# Patient Record
Sex: Female | Born: 1980 | Hispanic: No | Marital: Single | Smoking: Never smoker
Health system: Southern US, Community
[De-identification: ages and names within clinical notes are randomized; demographics above are authoritative.]

## PROBLEM LIST (undated history)

## (undated) ENCOUNTER — Inpatient Hospital Stay (HOSPITAL_COMMUNITY): Payer: Self-pay

## (undated) DIAGNOSIS — F329 Major depressive disorder, single episode, unspecified: Secondary | ICD-10-CM

## (undated) DIAGNOSIS — N809 Endometriosis, unspecified: Secondary | ICD-10-CM

## (undated) DIAGNOSIS — F909 Attention-deficit hyperactivity disorder, unspecified type: Secondary | ICD-10-CM

## (undated) DIAGNOSIS — F419 Anxiety disorder, unspecified: Secondary | ICD-10-CM

## (undated) DIAGNOSIS — R51 Headache: Secondary | ICD-10-CM

## (undated) DIAGNOSIS — E039 Hypothyroidism, unspecified: Secondary | ICD-10-CM

## (undated) DIAGNOSIS — F32A Depression, unspecified: Secondary | ICD-10-CM

## (undated) DIAGNOSIS — R519 Headache, unspecified: Secondary | ICD-10-CM

## (undated) DIAGNOSIS — G40209 Localization-related (focal) (partial) symptomatic epilepsy and epileptic syndromes with complex partial seizures, not intractable, without status epilepticus: Secondary | ICD-10-CM

## (undated) HISTORY — PX: OTHER SURGICAL HISTORY: SHX169

## (undated) HISTORY — PX: TONSILLECTOMY: SUR1361

## (undated) HISTORY — DX: Headache, unspecified: R51.9

## (undated) HISTORY — PX: ADENOIDECTOMY: SHX5191

## (undated) HISTORY — DX: Depression, unspecified: F32.A

## (undated) HISTORY — DX: Anxiety disorder, unspecified: F41.9

## (undated) HISTORY — DX: Major depressive disorder, single episode, unspecified: F32.9

## (undated) HISTORY — PX: SEPTOPLASTY: SUR1290

## (undated) HISTORY — DX: Headache: R51

---
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
## 2002-07-29 ENCOUNTER — Encounter: Admission: RE | Admit: 2002-07-29 | Discharge: 2002-07-29 | Payer: Self-pay | Admitting: Sports Medicine

## 2002-08-04 ENCOUNTER — Encounter: Admission: RE | Admit: 2002-08-04 | Discharge: 2002-08-04 | Payer: Self-pay | Admitting: Sports Medicine

## 2002-08-11 ENCOUNTER — Encounter: Admission: RE | Admit: 2002-08-11 | Discharge: 2002-08-11 | Payer: Self-pay | Admitting: Sports Medicine

## 2002-08-18 ENCOUNTER — Ambulatory Visit (HOSPITAL_COMMUNITY): Admission: RE | Admit: 2002-08-18 | Discharge: 2002-08-18 | Payer: Self-pay | Admitting: Sports Medicine

## 2002-10-05 ENCOUNTER — Encounter: Admission: RE | Admit: 2002-10-05 | Discharge: 2002-10-05 | Payer: Self-pay | Admitting: Family Medicine

## 2002-10-07 ENCOUNTER — Encounter: Admission: RE | Admit: 2002-10-07 | Discharge: 2002-10-07 | Payer: Self-pay | Admitting: Sports Medicine

## 2002-11-04 ENCOUNTER — Emergency Department (HOSPITAL_COMMUNITY): Admission: EM | Admit: 2002-11-04 | Discharge: 2002-11-04 | Payer: Self-pay | Admitting: Emergency Medicine

## 2002-11-12 ENCOUNTER — Encounter: Admission: RE | Admit: 2002-11-12 | Discharge: 2002-11-12 | Payer: Self-pay | Admitting: Sports Medicine

## 2002-11-13 ENCOUNTER — Ambulatory Visit (HOSPITAL_COMMUNITY): Admission: RE | Admit: 2002-11-13 | Discharge: 2002-11-13 | Payer: Self-pay | Admitting: Sports Medicine

## 2002-12-01 ENCOUNTER — Encounter: Admission: RE | Admit: 2002-12-01 | Discharge: 2002-12-01 | Payer: Self-pay | Admitting: Sports Medicine

## 2002-12-31 ENCOUNTER — Encounter: Admission: RE | Admit: 2002-12-31 | Discharge: 2002-12-31 | Payer: Self-pay

## 2003-01-13 ENCOUNTER — Encounter: Admission: RE | Admit: 2003-01-13 | Discharge: 2003-01-13 | Payer: Self-pay | Admitting: Family Medicine

## 2003-10-04 ENCOUNTER — Encounter: Admission: RE | Admit: 2003-10-04 | Discharge: 2003-10-04 | Payer: Self-pay | Admitting: Family Medicine

## 2003-11-19 ENCOUNTER — Encounter (INDEPENDENT_AMBULATORY_CARE_PROVIDER_SITE_OTHER): Payer: Self-pay | Admitting: *Deleted

## 2003-11-19 LAB — CONVERTED CEMR LAB

## 2003-11-29 ENCOUNTER — Emergency Department (HOSPITAL_COMMUNITY): Admission: EM | Admit: 2003-11-29 | Discharge: 2003-11-29 | Payer: Self-pay | Admitting: *Deleted

## 2003-12-01 ENCOUNTER — Encounter: Admission: RE | Admit: 2003-12-01 | Discharge: 2003-12-01 | Payer: Self-pay | Admitting: Family Medicine

## 2003-12-31 ENCOUNTER — Encounter: Admission: RE | Admit: 2003-12-31 | Discharge: 2003-12-31 | Payer: Self-pay | Admitting: Family Medicine

## 2004-01-03 ENCOUNTER — Encounter: Admission: RE | Admit: 2004-01-03 | Discharge: 2004-01-03 | Payer: Self-pay | Admitting: Sports Medicine

## 2004-01-04 ENCOUNTER — Encounter: Admission: RE | Admit: 2004-01-04 | Discharge: 2004-01-04 | Payer: Self-pay | Admitting: Family Medicine

## 2004-03-15 ENCOUNTER — Ambulatory Visit: Payer: Self-pay | Admitting: Sports Medicine

## 2004-05-25 ENCOUNTER — Emergency Department (HOSPITAL_COMMUNITY): Admission: EM | Admit: 2004-05-25 | Discharge: 2004-05-25 | Payer: Self-pay | Admitting: Family Medicine

## 2004-08-25 ENCOUNTER — Ambulatory Visit: Payer: Self-pay | Admitting: Sports Medicine

## 2004-09-08 ENCOUNTER — Ambulatory Visit: Payer: Self-pay | Admitting: Family Medicine

## 2004-09-13 ENCOUNTER — Ambulatory Visit: Payer: Self-pay | Admitting: Family Medicine

## 2005-11-10 ENCOUNTER — Emergency Department (HOSPITAL_COMMUNITY): Admission: EM | Admit: 2005-11-10 | Discharge: 2005-11-11 | Payer: Self-pay | Admitting: Emergency Medicine

## 2005-11-12 ENCOUNTER — Ambulatory Visit: Payer: Self-pay | Admitting: Sports Medicine

## 2006-07-18 DIAGNOSIS — E049 Nontoxic goiter, unspecified: Secondary | ICD-10-CM | POA: Insufficient documentation

## 2006-07-19 ENCOUNTER — Encounter (INDEPENDENT_AMBULATORY_CARE_PROVIDER_SITE_OTHER): Payer: Self-pay | Admitting: *Deleted

## 2007-07-16 ENCOUNTER — Encounter: Payer: Self-pay | Admitting: Family Medicine

## 2007-07-16 ENCOUNTER — Encounter: Payer: Self-pay | Admitting: *Deleted

## 2007-07-16 ENCOUNTER — Ambulatory Visit: Payer: Self-pay | Admitting: Family Medicine

## 2007-07-16 DIAGNOSIS — N949 Unspecified condition associated with female genital organs and menstrual cycle: Secondary | ICD-10-CM | POA: Insufficient documentation

## 2007-07-16 DIAGNOSIS — R87610 Atypical squamous cells of undetermined significance on cytologic smear of cervix (ASC-US): Secondary | ICD-10-CM | POA: Insufficient documentation

## 2007-07-16 LAB — CONVERTED CEMR LAB

## 2007-07-18 ENCOUNTER — Encounter: Admission: RE | Admit: 2007-07-18 | Discharge: 2007-07-18 | Payer: Self-pay | Admitting: Family Medicine

## 2007-07-22 ENCOUNTER — Encounter: Payer: Self-pay | Admitting: Family Medicine

## 2007-07-22 ENCOUNTER — Telehealth: Payer: Self-pay | Admitting: *Deleted

## 2008-08-14 IMAGING — US US TRANSVAGINAL NON-OB
1 series · 14 of 25 positions shown · non-contrast
Comparison: none

CLINICAL DATA: Pelvic pain.
 TRANSABDOMINAL AND TRANSVAGINAL PELVIC ULTRASOUND:
TECHNIQUE: Both transabdominal and transvaginal ultrasound examinations of the pelvis were performed, including evaluation of the uterus, ovaries, adnexal regions, and pelvic cul-de-sac.
 Uterus measures 9.5 cm sagittally with a depth of 5.5 cm and width of 6.3 cm.  Nabothian cysts are noted.  Endometrium is normal measuring 4.5 mm in thickness.   The right ovary is larger than the left containing a cyst with thin septations measuring 5.0 x 3.8 x 4.8 cm.  Only a trace amount of fluid is noted.

[Series 1: us transvaginal non-ob · 0.26mm/px · 14 of 63 slices shown]
[im 1/63]
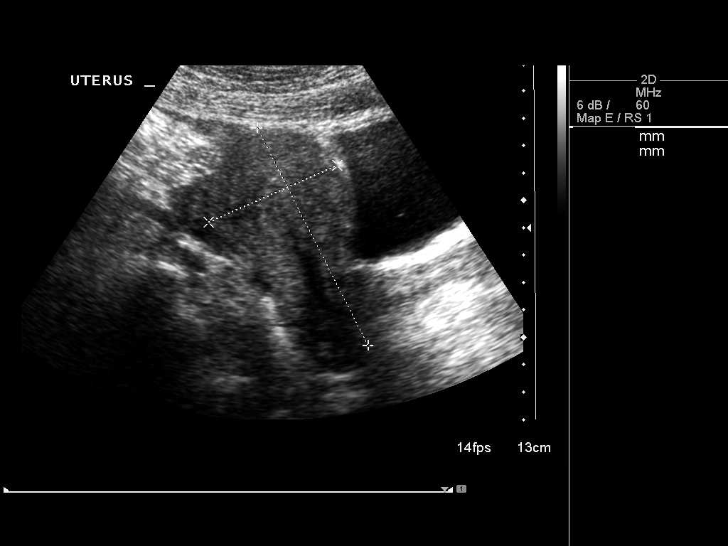
[im 6/63]
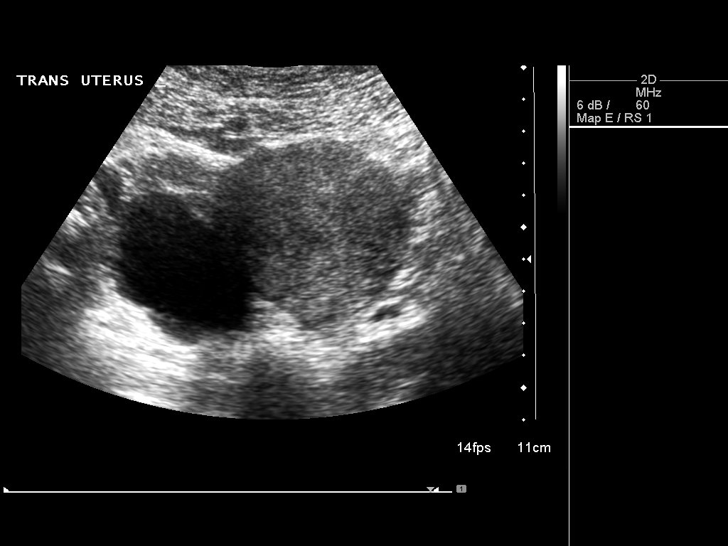
[im 11/63]
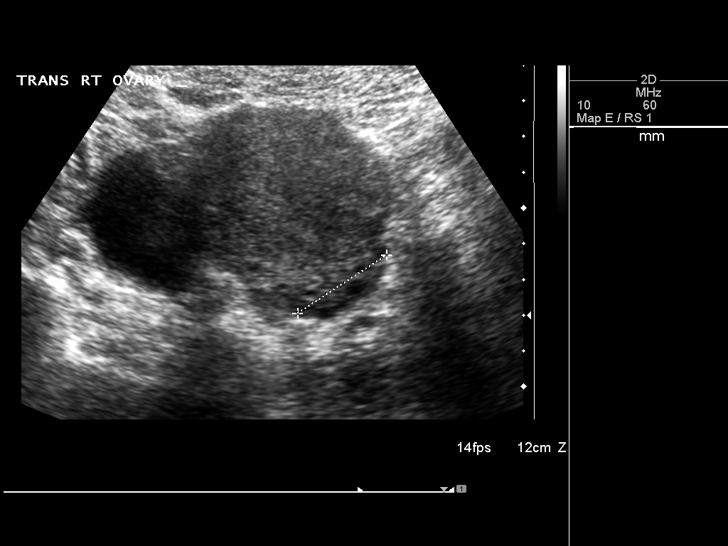
[im 16/63]
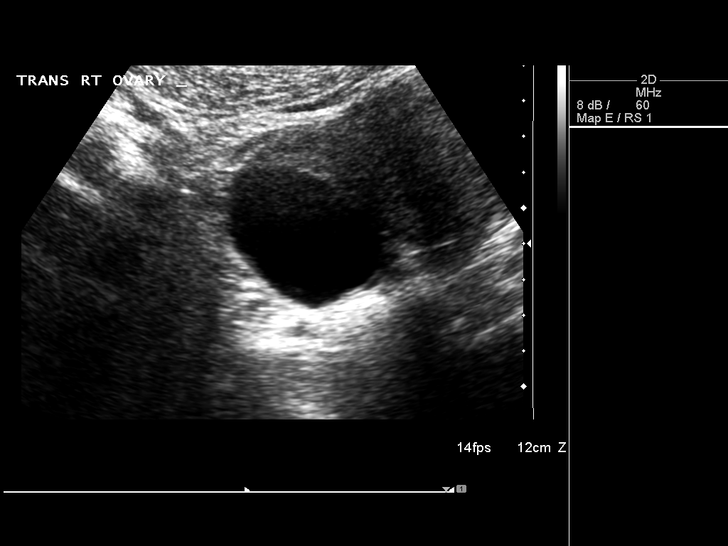
[im 21/63]
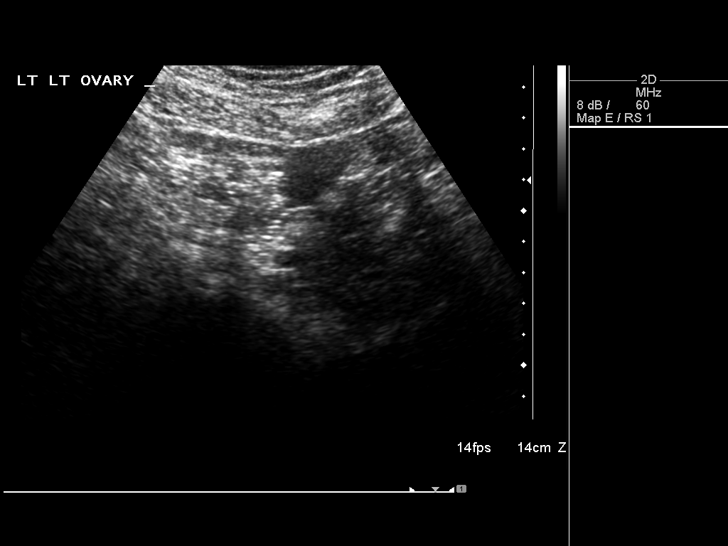
[im 24/63]
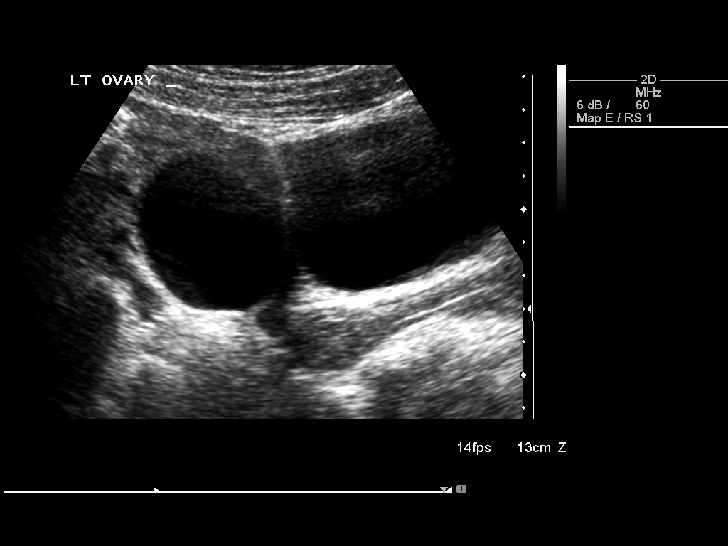
[im 29/63]
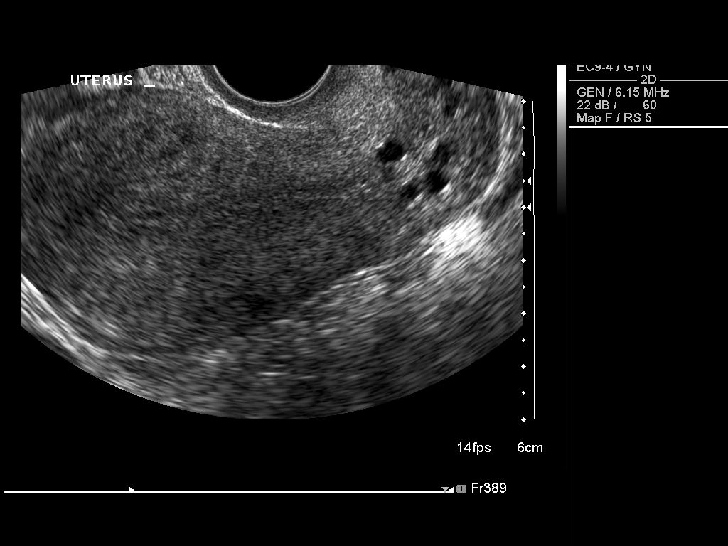
[im 34/63]
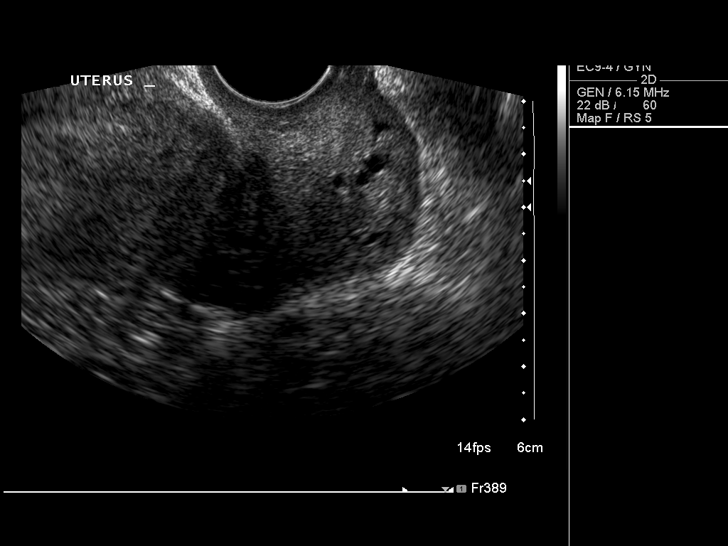
[im 39/63]
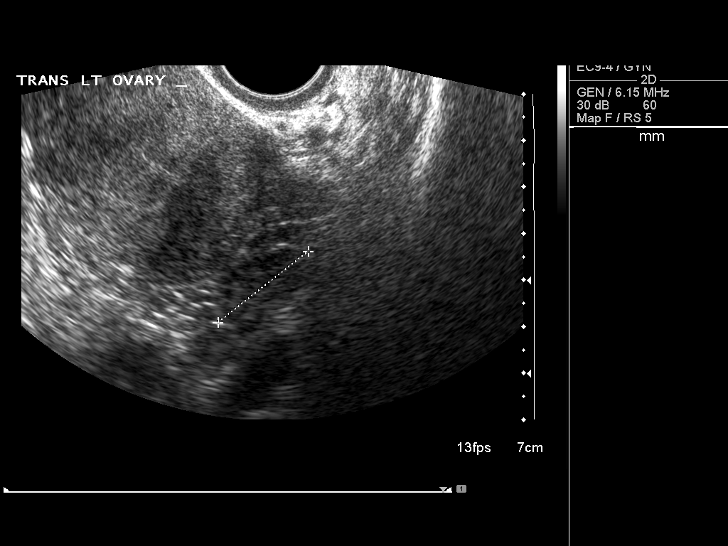
[im 42/63]
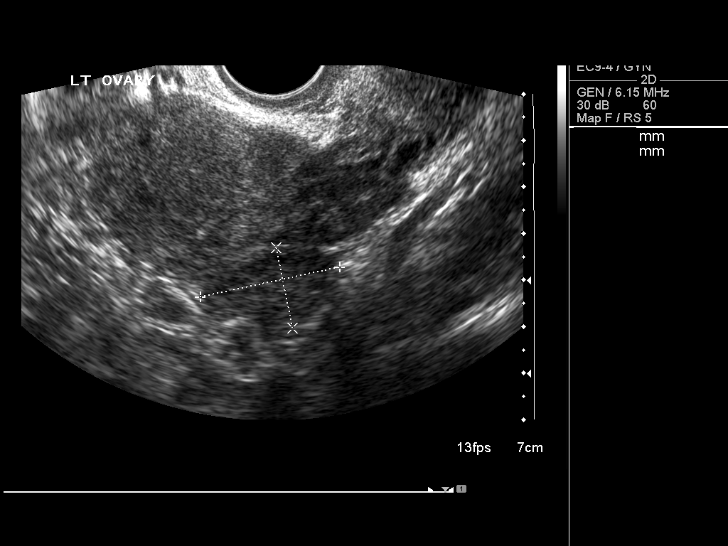
[im 47/63]
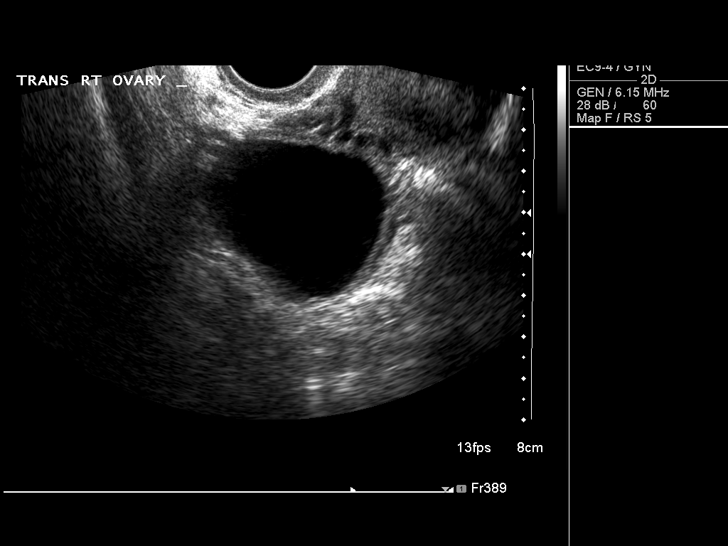
[im 52/63]
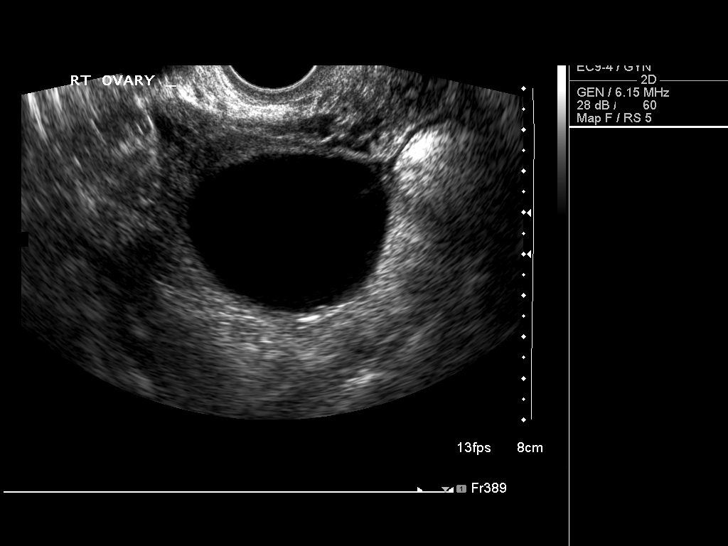
[im 57/63]
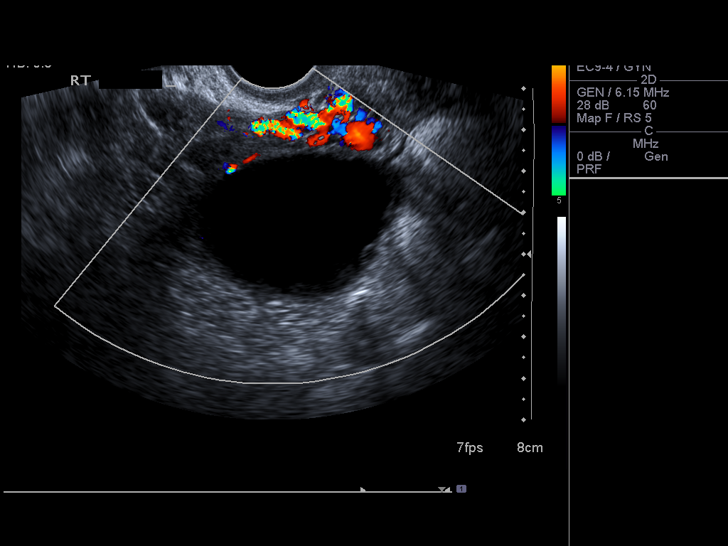
[im 63/63]
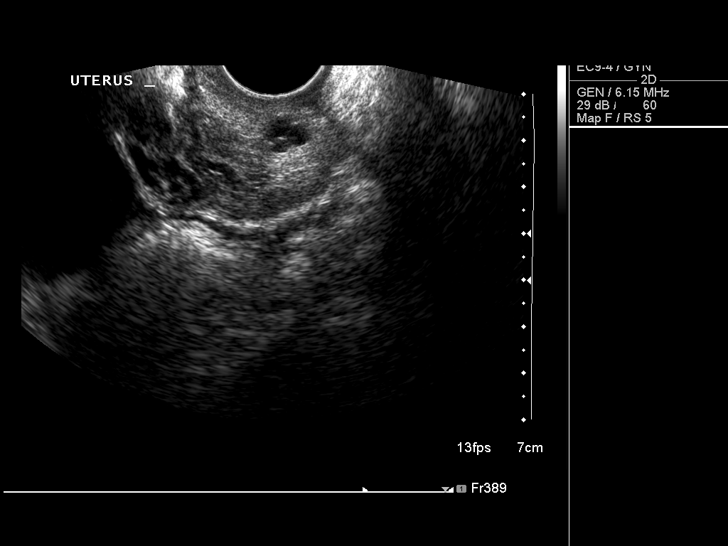

[14 of 25 positions shown; findings below may reference images not displayed]

IMPRESSION: 1.  Uterus and endometrium within normal limits.  
 2.  5 cm septated right ovarian cyst. Consider follow-up in two months.

## 2010-07-09 ENCOUNTER — Encounter: Payer: Self-pay | Admitting: *Deleted

## 2011-09-12 ENCOUNTER — Emergency Department (INDEPENDENT_AMBULATORY_CARE_PROVIDER_SITE_OTHER)
Admission: EM | Admit: 2011-09-12 | Discharge: 2011-09-12 | Disposition: A | Discharge status: Home / Self Care | Payer: Self-pay | Source: Home / Self Care | Attending: Emergency Medicine | Admitting: Emergency Medicine

## 2011-09-12 ENCOUNTER — Encounter (HOSPITAL_COMMUNITY): Payer: Self-pay | Admitting: *Deleted

## 2011-09-12 DIAGNOSIS — L259 Unspecified contact dermatitis, unspecified cause: Secondary | ICD-10-CM

## 2011-09-12 DIAGNOSIS — L709 Acne, unspecified: Secondary | ICD-10-CM

## 2011-09-12 DIAGNOSIS — L708 Other acne: Secondary | ICD-10-CM

## 2011-09-12 HISTORY — DX: Hypothyroidism, unspecified: E03.9

## 2011-09-12 HISTORY — DX: Attention-deficit hyperactivity disorder, unspecified type: F90.9

## 2011-09-12 MED ORDER — DOXYCYCLINE HYCLATE 100 MG PO TABS: 100.0000 mg | ORAL_TABLET | Freq: Two times a day (BID) | ORAL | Status: AC

## 2011-09-12 MED ORDER — PREDNISONE 10 MG PO TABS: ORAL_TABLET | ORAL | Status: DC

## 2011-09-12 MED ORDER — HYDROCORTISONE 1 % EX CREA: TOPICAL_CREAM | CUTANEOUS | Status: AC

## 2011-09-12 NOTE — ED Notes (Signed)
Pt c/o rash face onset yesterday am started with 3 small bumps under right eye now spreading

## 2011-09-12 NOTE — Discharge Instructions (Signed)
Contact Dermatitis Contact dermatitis is a reaction to certain substances that touch the skin. Contact dermatitis can be either irritant contact dermatitis or allergic contact dermatitis. Irritant contact dermatitis does not require previous exposure to the substance for a reaction to occur.Allergic contact dermatitis only occurs if you have been exposed to the substance before. Upon a repeat exposure, your body reacts to the substance.  CAUSES  Many substances can cause contact dermatitis. Irritant dermatitis is most commonly caused by repeated exposure to mildly irritating substances, such as:  Makeup.   Soaps.   Detergents.   Bleaches.   Acids.   Metal salts, such as nickel.  Allergic contact dermatitis is most commonly caused by exposure to:  Poisonous plants.   Chemicals (deodorants, shampoos).   Jewelry.   Latex.   Neomycin in triple antibiotic cream.   Preservatives in products, including clothing.  SYMPTOMS  The area of skin that is exposed may develop:  Dryness or flaking.   Redness.   Cracks.   Itching.   Pain or a burning sensation.   Blisters.  With allergic contact dermatitis, there may also be swelling in areas such as the eyelids, mouth, or genitals.  DIAGNOSIS  Your caregiver can usually tell what the problem is by doing a physical exam. In cases where the cause is uncertain and an allergic contact dermatitis is suspected, a patch skin test may be performed to help determine the cause of your dermatitis. TREATMENT Treatment includes protecting the skin from further contact with the irritating substance by avoiding that substance if possible. Barrier creams, powders, and gloves may be helpful. Your caregiver may also recommend:  Steroid creams or ointments applied 2 times daily. For best results, soak the rash area in cool water for 20 minutes. Then apply the medicine. Cover the area with a plastic wrap. You can store the steroid cream in the  refrigerator for a "chilly" effect on your rash. That may decrease itching. Oral steroid medicines may be needed in more severe cases.   Antibiotics or antibacterial ointments if a skin infection is present.   Antihistamine lotion or an antihistamine taken by mouth to ease itching.   Lubricants to keep moisture in your skin.   Burow's solution to reduce redness and soreness or to dry a weeping rash. Mix one packet or tablet of solution in 2 cups cool water. Dip a clean washcloth in the mixture, wring it out a bit, and put it on the affected area. Leave the cloth in place for 30 minutes. Do this as often as possible throughout the day.   Taking several cornstarch or baking soda baths daily if the area is too large to cover with a washcloth.  Harsh chemicals, such as alkalis or acids, can cause skin damage that is like a burn. You should flush your skin for 15 to 20 minutes with cold water after such an exposure. You should also seek immediate medical care after exposure. Bandages (dressings), antibiotics, and pain medicine may be needed for severely irritated skin.  HOME CARE INSTRUCTIONS  Avoid the substance that caused your reaction.   Keep the area of skin that is affected away from hot water, soap, sunlight, chemicals, acidic substances, or anything else that would irritate your skin.   Do not scratch the rash. Scratching may cause the rash to become infected.   You may take cool baths to help stop the itching.   Only take over-the-counter or prescription medicines as directed by your caregiver.     See your caregiver for follow-up care as directed to make sure your skin is healing properly.  SEEK MEDICAL CARE IF:   Your condition is not better after 3 days of treatment.   You seem to be getting worse.   You see signs of infection such as swelling, tenderness, redness, soreness, or warmth in the affected area.   You have any problems related to your medicines.  Document Released:  05/04/2000 Document Revised: 04/26/2011 Document Reviewed: 10/10/2010 [facility] Patient Information 2012 [place], [place].Acne Acne is a skin problem that causes pimples. Acne occurs when the pores in your skin get blocked. Your pores may become red, sore, and swollen (inflamed), or infected with a common skin bacterium (Propionibacterium acnes). Acne is a common skin problem. Up to 80% of people get acne at some time. Acne is especially common from the ages of 9 to 65. Acne usually goes away over time with proper treatment. CAUSES  Your pores each contain an oil gland. The oil glands make an oily substance called sebum. Acne happens when these glands get plugged with sebum, dead skin cells, and dirt. The P. acnes bacteria that are normally found in the oil glands then multiply, causing inflammation. Acne is commonly triggered by changes in your hormones. These hormonal changes can cause the oil glands to get bigger and to make more sebum. Factors that can make acne worse include:  Hormone changes during adolescence.   Hormone changes during women's menstrual cycles.   Hormone changes during pregnancy.   Oil-based cosmetics and hair products.   Harshly scrubbing the skin.   Strong soaps.   Stress.   Hormone problems due to certain diseases.   Long or oily hair rubbing against the skin.   Certain medicines.   Pressure from headbands, backpacks, or shoulder pads.   Exposure to certain oils and chemicals.  SYMPTOMS  Acne often occurs on the face, neck, chest, and upper back. Symptoms include:  Small, red bumps (pimples or papules).   Whiteheads (closed comedones).   Blackheads (open comedones).   Small, pus-filled pimples (pustules).   Big, red pimples or pustules that feel tender.  More severe acne can cause:  An infected area that contains a collection of pus (abscess).   Hard, painful, fluid-filled sacs (cysts).   Scars.  DIAGNOSIS  Your caregiver can usually tell what  the problem is by doing a physical exam. TREATMENT  There are many good treatments for acne. Some are available over-the-counter and some are available with a prescription. The treatment that is best for you depends on the type of acne you have and how severe it is. It may take 2 months of treatment before your acne gets better. Common treatments include:  Creams and lotions that prevent oil glands from clogging.   Creams and lotions that treat or prevent infections and inflammation.   Antibiotics applied to the skin or taken as a pill.   Pills that decrease sebum production.   Birth control pills.   Light or laser treatments.   Minor surgery.   Injections of medicine into the affected areas.   Chemicals that cause peeling of the skin.  HOME CARE INSTRUCTIONS  Good skin care is the most important part of treatment.  Wash your skin gently at least twice a day and after exercise. Always wash your skin before bed.   Use mild soap.   After each wash, apply a water-based skin moisturizer.   Keep your hair clean and off of your face.  Shampoo your hair daily.   Only take medicines as directed by your caregiver.   Use a sunscreen or sunblock with SPF 30 or greater. This is especially important when you are using acne medicines.   Choose cosmetics that are noncomedogenic. This means they do not plug the oil glands.   Avoid leaning your chin or forehead on your hands.   Avoid wearing tight headbands or hats.   Avoid picking or squeezing your pimples. This can make your acne worse and cause scarring.  SEEK MEDICAL CARE IF:   Your acne is not better after 8 weeks.   Your acne gets worse.   You have a large area of skin that is red or tender.  Document Released: 05/04/2000 Document Revised: 04/26/2011 Document Reviewed: 02/23/2011 [facility] Patient Information 2012 [place], [place].

## 2011-09-12 NOTE — ED Provider Notes (Signed)
Chief Complaint  Patient presents with  . Rash    History of Present Illness:   Melanie Cervantes is a 30 year old female who has had an itchy rash on her face since yesterday. This seems to be spreading. She has no rash anywhere else. No fever or chills, no URI symptoms. She also has a history of facial acne. She uses Clearasil cleanser. Her only medications are thyroid and Adderall which she has taken for years. She has not come in contact with any plants or animals other than her dog. She denies any contactants, new cosmetics, new soaps, detergents, washing powders, fabric softeners, dry sheets, new clothing, new or unusual medications or foods. She denies any respiratory symptoms.  Review of Systems:  Other than noted above, the patient denies any of the following symptoms: Systemic:  No fever, chills, sweats, weight loss, or fatigue. ENT:  No nasal congestion, rhinorrhea, sore throat, swelling of lips, tongue or throat. Resp:  No cough, wheezing, or shortness of breath. Skin:  No rash, itching, nodules, or suspicious lesions.  PMFSH:  Past medical history, family history, social history, meds, and allergies were reviewed.  Physical Exam:   Vital signs:  BP 111/68  Pulse 76  Temp(Src) 98.6 F (37 C) (Oral)  Resp 16  SpO2 99%  LMP 09/03/2011 Gen:  Alert, oriented, in no distress. Skin:  He has an erythematous, maculopapular rash on her face. She also has a few pustules consistent with acne. Her skin was otherwise clear.  Assessment:  The primary encounter diagnosis was Contact dermatitis. A diagnosis of Acne was also pertinent to this visit.  Plan:   1.  The following meds were prescribed:   New Prescriptions   DOXYCYCLINE (VIBRA-TABS) 100 MG TABLET    Take 1 tablet (100 mg total) by mouth 2 (two) times daily.   HYDROCORTISONE CREAM 1 %    Apply to affected area 2 times daily   PREDNISONE (DELTASONE) 10 MG TABLET    Take 4 tabs daily for 4 days, 3 tabs daily for 4 days, 2 tabs daily for 4  days, then 1 tab daily for 4 days.   2.  The patient was instructed in symptomatic care and handouts were given. 3.  The patient was told to return if becoming worse in any way, if no better in 3 or 4 days, and given some red flag symptoms that would indicate earlier return.     Reuben Likes, MD 09/12/11 [phone]

## 2017-03-01 ENCOUNTER — Emergency Department (HOSPITAL_COMMUNITY): Payer: Self-pay

## 2017-03-01 ENCOUNTER — Encounter (HOSPITAL_COMMUNITY): Payer: Self-pay | Admitting: Emergency Medicine

## 2017-03-01 ENCOUNTER — Emergency Department (HOSPITAL_COMMUNITY)
Admission: EM | Admit: 2017-03-01 | Discharge: 2017-03-02 | Disposition: A | Discharge status: Home / Self Care | Payer: Self-pay | Attending: Emergency Medicine | Admitting: Emergency Medicine

## 2017-03-01 DIAGNOSIS — R1011 Right upper quadrant pain: Secondary | ICD-10-CM | POA: Insufficient documentation

## 2017-03-01 DIAGNOSIS — Z79899 Other long term (current) drug therapy: Secondary | ICD-10-CM | POA: Insufficient documentation

## 2017-03-01 DIAGNOSIS — R0602 Shortness of breath: Secondary | ICD-10-CM | POA: Insufficient documentation

## 2017-03-01 DIAGNOSIS — R109 Unspecified abdominal pain: Secondary | ICD-10-CM

## 2017-03-01 LAB — URINALYSIS, ROUTINE W REFLEX MICROSCOPIC
BILIRUBIN URINE: NEGATIVE
GLUCOSE, UA: NEGATIVE mg/dL
HGB URINE DIPSTICK: NEGATIVE
Ketones, ur: NEGATIVE mg/dL
LEUKOCYTES UA: NEGATIVE
Nitrite: NEGATIVE
PROTEIN: NEGATIVE mg/dL
Specific Gravity, Urine: 1.001 — ABNORMAL LOW (ref 1.005–1.030)
pH: 7 (ref 5.0–8.0)

## 2017-03-01 LAB — COMPREHENSIVE METABOLIC PANEL
ALT: 28 U/L (ref 14–54)
AST: 28 U/L (ref 15–41)
Albumin: 4 g/dL (ref 3.5–5.0)
Alkaline Phosphatase: 53 U/L (ref 38–126)
Anion gap: 10 (ref 5–15)
BUN: 9 mg/dL (ref 6–20)
CHLORIDE: 105 mmol/L (ref 101–111)
CO2: 22 mmol/L (ref 22–32)
Calcium: 9.1 mg/dL (ref 8.9–10.3)
Creatinine, Ser: 0.88 mg/dL (ref 0.44–1.00)
GFR calc non Af Amer: >60 mL/min (ref >60)
Glucose, Bld: 92 mg/dL (ref 65–99)
Potassium: 3.6 mmol/L (ref 3.5–5.1)
SODIUM: 137 mmol/L (ref 135–145)
Total Bilirubin: 0.7 mg/dL (ref 0.3–1.2)
Total Protein: 7.8 g/dL (ref 6.5–8.1)

## 2017-03-01 LAB — CBC
HCT: 40.5 % (ref 36.0–46.0)
Hemoglobin: 13.5 g/dL (ref 12.0–15.0)
MCH: 31.2 pg (ref 26.0–34.0)
MCHC: 33.3 g/dL (ref 30.0–36.0)
MCV: 93.5 fL (ref 78.0–100.0)
Platelets: 375 10*3/uL (ref 150–400)
RBC: 4.33 MIL/uL (ref 3.87–5.11)
RDW: 12.7 % (ref 11.5–15.5)
WBC: 10.4 10*3/uL (ref 4.0–10.5)

## 2017-03-01 LAB — POC URINE PREG, ED: Preg Test, Ur: NEGATIVE

## 2017-03-01 LAB — LIPASE, BLOOD: LIPASE: 36 U/L (ref 11–51)

## 2017-03-01 NOTE — ED Triage Notes (Signed)
Patient with numb feeling in throat, states that she is having some shortness of breath due to the numbness that she is feeling in her throat.  She also states she is having some abdominal pain on the right side, with diarrhea.

## 2017-03-01 NOTE — ED Provider Notes (Signed)
[facility]-EMERGENCY DEPT Provider Note   CSN: [id number] Arrival date & time: 03/01/17  1914     History   Chief Complaint Chief Complaint  Patient presents with  . Shortness of Breath  . Diarrhea  . Abdominal Pain    HPI Melanie Cervantes is a 36 y.o. female.  HPI Melanie Cervantes is a 36 y.o. female with history of hypothyroidism, ADHD, presents to emergency department with 2 separate complaints. Her initial complaint is intermittent episodes of where she feels like her throat is starting to close it is difficult to breathe. She states her symptoms started to happen after she choked while eating at the restaurant. She states they had to perform a Heimlich maneuver. Since then she has had intermittent episodes, and states that she is scared that she is going to choke again and when he thinks about that episode is when her symptoms start. She has seen her primary care doctor for this who gave her prescription for antianxiety medicine which she has not started yet. She is also taking Vistaril for panic attacks as needed. She states normally after she has this episode she takes Vistaril and her symptoms improve. She is currently asymptomatic. She denies any sore throat. She states that she does feel like it's hard to swallow and it turns into difficulty breathing. Still complaining of right upper quadrant abdominal pain that started several days ago. She denies associated nausea or vomiting. She does report some loose stools.  Past Medical History:  Diagnosis Date  . ADHD (attention deficit hyperactivity disorder)   . Hypothyroid     Patient Active Problem List   Diagnosis Date Noted  . UNSPEC SYMPTOM ASSOC W/FEMALE GENITAL ORGANS 07/16/2007  . ASCUS PAP 07/16/2007  . GOITER NOS 07/18/2006    Past Surgical History:  Procedure Laterality Date  . ADENOIDECTOMY    . CESAREAN SECTION    . SEPTOPLASTY    . TONSILLECTOMY      OB History    No data available       Home  Medications    Prior to Admission medications   Medication Sig Start Date End Date Taking? Authorizing Provider  amphetamine-dextroamphetamine (ADDERALL XR) 25 MG 24 hr capsule Take 25 mg by mouth every morning.   Yes [provider]  desogestrel-ethinyl estradiol (DESOGEN) 0.15-30 MG-MCG per tablet Take 1 tablet by mouth daily.     Yes [provider]  levothyroxine (SYNTHROID, LEVOTHROID) 25 MCG tablet Take 25 mcg by mouth daily before breakfast.   Yes [provider]    Family History No family history on file.  Social History Social History  Substance Use Topics  . Smoking status: Never Smoker  . Smokeless tobacco: Never Used  . Alcohol use No     Allergies   Patient has no known allergies.   Review of Systems Review of Systems  Constitutional: Negative for chills and fever.  HENT: Positive for trouble swallowing.   Respiratory: Positive for chest tightness and shortness of breath. Negative for cough.   Cardiovascular: Negative for chest pain, palpitations and leg swelling.  Gastrointestinal: Positive for abdominal pain and diarrhea. Negative for nausea and vomiting.  Genitourinary: Negative for dysuria, flank pain, pelvic pain, vaginal bleeding, vaginal discharge and vaginal pain.  Musculoskeletal: Negative for arthralgias, myalgias, neck pain and neck stiffness.  Skin: Negative for rash.  Neurological: Negative for dizziness, weakness and headaches.  All other systems reviewed and are negative.    Physical Exam Updated Vital Signs BP 111/80 (  BP Location: Right Arm)   Pulse 71   Temp 98.7 F (37.1 C) (Oral)   Resp 14   SpO2 99%   Physical Exam  Constitutional: She appears well-developed and well-nourished. No distress.  HENT:  Head: Normocephalic.  Eyes: Conjunctivae are normal.  Neck: Neck supple.  Cardiovascular: Normal rate, regular rhythm and normal heart sounds.   Pulmonary/Chest: Effort normal and breath sounds normal. No  respiratory distress. She has no wheezes. She has no rales.  Abdominal: Soft. Bowel sounds are normal. She exhibits no distension. There is tenderness. There is no rebound.  Right upper quadrant tenderness  Musculoskeletal: She exhibits no edema.  Neurological: She is alert.  Skin: Skin is warm and dry.  Psychiatric: She has a normal mood and affect. Her behavior is normal.  Nursing note and vitals reviewed.    ED Treatments / Results  Labs (all labs ordered are listed, but only abnormal results are displayed) Labs Reviewed  URINALYSIS, ROUTINE W REFLEX MICROSCOPIC - Abnormal; Notable for the following:       Result Value   Color, Urine COLORLESS (*)    Specific Gravity, Urine 1.001 (*)    All other components within normal limits  LIPASE, BLOOD  COMPREHENSIVE METABOLIC PANEL  CBC  POC URINE PREG, ED    EKG  EKG Interpretation  Date/Time:  Friday March 01 2017 19:32:40 EDT Ventricular Rate:  95 PR Interval:  136 QRS Duration: 74 QT Interval:  354 QTC Calculation: 444 R Axis:   73 Text Interpretation:  Normal sinus rhythm Normal ECG No old tracing to compare Confirmed by Azalia Bilis ([id number]) on 03/01/2017 11:14:50 PM       Radiology US Abdomen Limited Ruq  Result Date: 03/02/2017 CLINICAL DATA:  Acute onset of right upper quadrant abdominal pain. Initial encounter. EXAM: ULTRASOUND ABDOMEN LIMITED RIGHT UPPER QUADRANT COMPARISON:  None. FINDINGS: Gallbladder: No gallstones or wall thickening visualized. No sonographic Murphy sign noted by sonographer. Common bile duct: Diameter: 0.4 cm, within normal limits in caliber. Liver: A mildly hypoechoic lesion is noted at the right hepatic lobe, measuring 2.1 x 2.0 x 1.8 cm. Heterogeneous echotexture and increased parenchymal echogenicity likely reflects fatty infiltration. Portal vein is patent on color Doppler imaging with normal direction of blood flow towards the liver. IMPRESSION: 1. No acute abnormality at the right upper  quadrant. 2. Mildly hypoechoic 2.1 cm lesion at the right hepatic lobe. This may reflect an adenoma. Would correlate with LFTs. 3. Mild fatty infiltration within the liver. Electronically Signed   By: Roanna Raider M.D.   On: 03/02/2017 00:19    Procedures Procedures (including critical care time)  Medications Ordered in ED Medications - No data to display   Initial Impression / Assessment and Plan / ED Course  I have reviewed the triage vital signs and the nursing notes.  Pertinent labs & imaging results that were available during my care of the patient were reviewed by me and considered in my medical decision making (see chart for details).     Patient in emergency department with 2 separate complaints. She is mainly complaining of a new right upper quadrant pain that started several days ago and has been constant since. No prior abdominal problems. No vomiting. No fever or chills. Labs obtained by triage, normal. We'll get right upper quadrant ultrasound to rule out cholelithiasis or cholecystitis. Patient is also complaining of episodes where her throat gets tight and she is unable to swallow and states that these episodes turn  into her shortness of breath. This certainly seemed likely related to panic attacks or anxiety. Especially because they started after she choked. She is already seeing her doctor who put her on anxiety medicines which she has not taken yet. She is currently taking Vistaril which helps.   12:34 AM Patient's ultrasound is negative other than mild fatty infiltration of the liver and 2.1 cm lesion at the right hepatic lobe. LFTs are normal. Normal gallbladder. Discussed with the results with the patient. Also advised to follow with her primary care doctor to recheck her thyroid function, since that could be what is making her more anxious or even cause her to have some throat discomfort. Patient agreed. She will follow-up with her PCP at [facility]. At this time she is nontoxic  appearing. No evidence of any throat swelling or difficulty breathing. Vital signs are normal. Stable for discharge  Vitals:   03/01/17 2300 03/01/17 2330 03/02/17 0000 03/02/17 0016  BP: (!) 128/91 (!) 115/99 106/71 108/82  Pulse: 66 67 65 74  Resp:      Temp:      TempSrc:      SpO2: 100% 98% 99% 98%     Final Clinical Impressions(s) / ED Diagnoses   Final diagnoses:  Abdominal pain  Right upper quadrant abdominal pain  Shortness of breath    New Prescriptions New Prescriptions   No medications on file     Jaynie Crumble, Cordelia Poche 03/02/17 0041    Azalia Bilis, MD 03/02/17 [phone]

## 2017-03-01 NOTE — ED Notes (Signed)
Patient transported to Ultrasound 

## 2017-03-02 NOTE — ED Notes (Signed)
Pt departed in NAD, refused use of wheelchair.  

## 2017-03-02 NOTE — Discharge Instructions (Signed)
Please follow-up with your family doctor for referral to a therapist for possible panic attacks and anxiety. Please have them check your thyroid studies as well. Return if worsening.

## 2017-08-23 ENCOUNTER — Other Ambulatory Visit: Payer: Self-pay

## 2017-08-23 ENCOUNTER — Encounter (HOSPITAL_COMMUNITY): Payer: Self-pay

## 2017-08-23 ENCOUNTER — Emergency Department (HOSPITAL_COMMUNITY): Payer: Non-veteran care

## 2017-08-23 ENCOUNTER — Emergency Department (HOSPITAL_COMMUNITY)
Admission: EM | Admit: 2017-08-23 | Discharge: 2017-08-23 | Disposition: A | Discharge status: Home / Self Care | Payer: Non-veteran care | Attending: Emergency Medicine | Admitting: Emergency Medicine

## 2017-08-23 DIAGNOSIS — Z3A00 Weeks of gestation of pregnancy not specified: Secondary | ICD-10-CM | POA: Insufficient documentation

## 2017-08-23 DIAGNOSIS — O9989 Other specified diseases and conditions complicating pregnancy, childbirth and the puerperium: Secondary | ICD-10-CM | POA: Diagnosis not present

## 2017-08-23 DIAGNOSIS — O283 Abnormal ultrasonic finding on antenatal screening of mother: Secondary | ICD-10-CM | POA: Insufficient documentation

## 2017-08-23 DIAGNOSIS — R109 Unspecified abdominal pain: Secondary | ICD-10-CM

## 2017-08-23 DIAGNOSIS — R102 Pelvic and perineal pain: Secondary | ICD-10-CM | POA: Diagnosis not present

## 2017-08-23 DIAGNOSIS — O99280 Endocrine, nutritional and metabolic diseases complicating pregnancy, unspecified trimester: Secondary | ICD-10-CM | POA: Diagnosis not present

## 2017-08-23 DIAGNOSIS — O3680X0 Pregnancy with inconclusive fetal viability, not applicable or unspecified: Secondary | ICD-10-CM

## 2017-08-23 DIAGNOSIS — Z79899 Other long term (current) drug therapy: Secondary | ICD-10-CM | POA: Insufficient documentation

## 2017-08-23 LAB — CBC
HCT: 40.2 % (ref 36.0–46.0)
HEMOGLOBIN: 14 g/dL (ref 12.0–15.0)
MCH: 33.3 pg (ref 26.0–34.0)
MCHC: 34.8 g/dL (ref 30.0–36.0)
MCV: 95.7 fL (ref 78.0–100.0)
PLATELETS: 308 10*3/uL (ref 150–400)
RBC: 4.2 MIL/uL (ref 3.87–5.11)
RDW: 12.7 % (ref 11.5–15.5)
WBC: 13.1 10*3/uL — ABNORMAL HIGH (ref 4.0–10.5)

## 2017-08-23 LAB — URINALYSIS, ROUTINE W REFLEX MICROSCOPIC
BILIRUBIN URINE: NEGATIVE
GLUCOSE, UA: NEGATIVE mg/dL
Ketones, ur: NEGATIVE mg/dL
LEUKOCYTES UA: NEGATIVE
NITRITE: NEGATIVE
PROTEIN: NEGATIVE mg/dL
SPECIFIC GRAVITY, URINE: 1.008 (ref 1.005–1.030)
pH: 6 (ref 5.0–8.0)

## 2017-08-23 LAB — COMPREHENSIVE METABOLIC PANEL
ALBUMIN: 4.3 g/dL (ref 3.5–5.0)
ALT: 32 U/L (ref 14–54)
ANION GAP: 9 (ref 5–15)
AST: 25 U/L (ref 15–41)
Alkaline Phosphatase: 44 U/L (ref 38–126)
BUN: 8 mg/dL (ref 6–20)
CHLORIDE: 105 mmol/L (ref 101–111)
CO2: 23 mmol/L (ref 22–32)
Calcium: 8.8 mg/dL — ABNORMAL LOW (ref 8.9–10.3)
Creatinine, Ser: 0.58 mg/dL (ref 0.44–1.00)
GFR calc non Af Amer: >60 mL/min (ref >60)
GLUCOSE: 93 mg/dL (ref 65–99)
POTASSIUM: 3.5 mmol/L (ref 3.5–5.1)
SODIUM: 137 mmol/L (ref 135–145)
Total Bilirubin: 0.6 mg/dL (ref 0.3–1.2)
Total Protein: 7.7 g/dL (ref 6.5–8.1)

## 2017-08-23 LAB — HCG, QUANTITATIVE, PREGNANCY: HCG, BETA CHAIN, QUANT, S: 2903 m[IU]/mL — AB (ref <5)

## 2017-08-23 LAB — LIPASE, BLOOD: Lipase: 35 U/L (ref 11–51)

## 2017-08-23 NOTE — ED Notes (Signed)
Pt ambulatory to restroom

## 2017-08-23 NOTE — ED Provider Notes (Signed)
[facility]-EMERGENCY DEPT Provider Note   CSN: [id number] Arrival date & time: 08/23/17  1639     History   Chief Complaint Chief Complaint  Patient presents with  . Abdominal Cramping    pregnant    HPI Melanie Cervantes is a [id number] 37 y.o. female who presents to the emergency department with a chief complaint of abdominal cramping.  The patient reports that she was seen by her primary care provider at the [facility] yesterday for dizziness and was diagnosed with BPPV.  Pregnancy test performed at the visit was positive.  She reports that she repeated a home pregnancy test last night which was also positive.  She reports that she last had intercourse on 3/25, but had previously had intercourse during the first week of March with a different female partner.  She reports that she had a shortened menstrual cycle during the second week of March.  She is not currently on birth control.  She reports that she had 2 days of light spotting 7 to 8 days ago that resolved after 2 days.  However, she reports mild bilateral lower abdominal cramping over the last week.  No additional vaginal bleeding.  She denies nausea, vomiting, diarrhea, hematuria, dysuria, back pain, rash, chest pain, or dyspnea.  There is treatment prior to arrival.  Blood type is A+.  The history is provided by the patient. No language interpreter was used.  Abdominal Cramping  Associated symptoms include abdominal pain. Pertinent negatives include no chest pain, no headaches and no shortness of breath.    Past Medical History:  Diagnosis Date  . ADHD (attention deficit hyperactivity disorder)   . Hypothyroid     Patient Active Problem List   Diagnosis Date Noted  . UNSPEC SYMPTOM ASSOC W/FEMALE GENITAL ORGANS 07/16/2007  . ASCUS PAP 07/16/2007  . GOITER NOS 07/18/2006    Past Surgical History:  Procedure Laterality Date  . ADENOIDECTOMY    . CESAREAN SECTION    . SEPTOPLASTY    . TONSILLECTOMY        OB History   None      Home Medications    Prior to Admission medications   Medication Sig Start Date End Date Taking? Authorizing Provider  levETIRAcetam (KEPPRA) 250 MG tablet Take 500 mg by mouth 3 (three) times daily.   Yes [provider]  levothyroxine (SYNTHROID, LEVOTHROID) 25 MCG tablet Take 25 mcg by mouth daily before breakfast.   Yes [provider]  Vilazodone HCl 20 MG TABS Take 40 tablets by mouth daily.   Yes [provider]    Family History History reviewed. No pertinent family history.  Social History Social History   Tobacco Use  . Smoking status: Never Smoker  . Smokeless tobacco: Never Used  Substance Use Topics  . Alcohol use: No  . Drug use: No     Allergies   Patient has no known allergies.   Review of Systems Review of Systems  Constitutional: Negative for activity change, chills and fever.  Respiratory: Negative for shortness of breath.   Cardiovascular: Negative for chest pain.  Gastrointestinal: Positive for abdominal pain. Negative for diarrhea, nausea and vomiting.  Genitourinary: Positive for vaginal bleeding (resolved). Negative for dysuria and hematuria.  Musculoskeletal: Negative for back pain.  Skin: Negative for rash.  Allergic/Immunologic: Negative for immunocompromised state.  Neurological: Negative for weakness, numbness and headaches.  Psychiatric/Behavioral: Negative for confusion.     Physical Exam Updated Vital Signs BP 122/82 (BP Location:  Left Arm)   Pulse 82   Temp 98.4 F (36.9 C) (Oral)   Resp 18   Ht 5\' 5"  (1.651 m)   Wt 83.9 kg (185 lb)   SpO2 100%   BMI 30.79 kg/m   Physical Exam  Constitutional: No distress.  HENT:  Head: Normocephalic.  Eyes: Conjunctivae are normal. No scleral icterus.  Neck: Normal range of motion. Neck supple.  Cardiovascular: Normal rate, regular rhythm, normal heart sounds and intact distal pulses. Exam reveals no gallop and no friction  rub.  No murmur heard. Pulmonary/Chest: Effort normal and breath sounds normal. No stridor. No respiratory distress. She has no wheezes. She has no rales. She exhibits no tenderness.  Abdominal: Soft. Bowel sounds are normal. She exhibits no distension and no mass. There is tenderness. There is no rebound and no guarding. No hernia.  Mild reproducible tenderness to palpation in the right and left lower quadrants.  No rebound or guarding.  The remainder of the abdominal exam is unremarkable.  No CVA tenderness bilaterally.  No tenderness over McBurney's point.  No peritoneal signs.  Musculoskeletal: Normal range of motion.  Neurological: She is alert.  Skin: Skin is warm. Capillary refill takes less than 2 seconds. No rash noted. No erythema. No pallor.  Psychiatric: Her behavior is normal.  Nursing note and vitals reviewed.  ED Treatments / Results  Labs (all labs ordered are listed, but only abnormal results are displayed) Labs Reviewed  HCG, QUANTITATIVE, PREGNANCY - Abnormal; Notable for the following components:      Result Value   hCG, Beta Chain, Quant, S 2,903 (*)    All other components within normal limits  CBC - Abnormal; Notable for the following components:   WBC 13.1 (*)    All other components within normal limits  COMPREHENSIVE METABOLIC PANEL - Abnormal; Notable for the following components:   Calcium 8.8 (*)    All other components within normal limits  URINALYSIS, ROUTINE W REFLEX MICROSCOPIC - Abnormal; Notable for the following components:   Hgb urine dipstick SMALL (*)    Bacteria, UA RARE (*)    Squamous Epithelial / LPF 0-5 (*)    All other components within normal limits  LIPASE, BLOOD    EKG None  Radiology [place] Ob Comp < 14 Wks  Result Date: 08/23/2017 CLINICAL DATA:  Pelvic cramping EXAM: OBSTETRIC <14 WK US AND TRANSVAGINAL OB US TECHNIQUE: Both transabdominal and transvaginal ultrasound examinations were performed for complete evaluation of the  gestation as well as the maternal uterus, adnexal regions, and pelvic cul-de-sac. Transvaginal technique was performed to assess early pregnancy. COMPARISON:  07/18/2007 pelvic ultrasound FINDINGS: Intrauterine gestational sac: None Yolk sac:  Not Visualized. Embryo:  Not Visualized. Cardiac Activity: Not Visualized. Heart Rate: Not applicable MSD: Not applicable CRL: No fetal pole noted. Subchorionic hemorrhage:  None visualized. Maternal uterus/adnexae: Neither ovary was visualized. The uterus measured 10.4 x 6.2 x 7 cm and is free of focal mass. Endometrial lining is 9.6 mm in thickness. No ectopic pregnancy is visualized. IMPRESSION: No findings for the patient's pelvic cramping. Intrauterine or ectopic pregnancy is noted. The patient is hCG level is currently pending and if positive, findings may represent a pregnancy of unknown location for which serial HCG and follow-up ultrasound would be recommended. Electronically Signed   By: Tollie Ethavid  Kwon M.D.   On: 08/23/2017 19:31   [place] Ob Transvaginal  Result Date: 08/23/2017 CLINICAL DATA:  Pelvic cramping EXAM: OBSTETRIC <14 WK US AND TRANSVAGINAL OB  US TECHNIQUE: Both transabdominal and transvaginal ultrasound examinations were performed for complete evaluation of the gestation as well as the maternal uterus, adnexal regions, and pelvic cul-de-sac. Transvaginal technique was performed to assess early pregnancy. COMPARISON:  07/18/2007 pelvic ultrasound FINDINGS: Intrauterine gestational sac: None Yolk sac:  Not Visualized. Embryo:  Not Visualized. Cardiac Activity: Not Visualized. Heart Rate: Not applicable MSD: Not applicable CRL: No fetal pole noted. Subchorionic hemorrhage:  None visualized. Maternal uterus/adnexae: Neither ovary was visualized. The uterus measured 10.4 x 6.2 x 7 cm and is free of focal mass. Endometrial lining is 9.6 mm in thickness. No ectopic pregnancy is visualized. IMPRESSION: No findings for the patient's pelvic cramping. Intrauterine or  ectopic pregnancy is noted. The patient is hCG level is currently pending and if positive, findings may represent a pregnancy of unknown location for which serial HCG and follow-up ultrasound would be recommended. Electronically Signed   By: Tollie Eth M.D.   On: 08/23/2017 19:31    Procedures Procedures (including critical care time)  Medications Ordered in ED Medications - No data to display   Initial Impression / Assessment and Plan / ED Course  I have reviewed the triage vital signs and the nursing notes.  Pertinent labs & imaging results that were available during my care of the patient were reviewed by me and considered in my medical decision making (see chart for details).     37 year old [id number] presenting with abdominal cramping over the last week.  She had a positive pregnancy test at her PCPs office yesterday.  She had 2 days of light vaginal bleeding last week, which is since resolved.  No other associated symptoms.  Quantitative hCG is 2903 with likely gestational age 14-4 weeks.  The patient is concerned because she had 2 sexual partners over the last month.  Transvaginal ultrasound negative for ectopic or intrauterine pregnancy.  Labs are otherwise unremarkable.  Blood type is A+. Rhogam not needed.  Discussed with the patient that she needed to have a repeat quantitative hCG performed and 48 or up to 72 hours given the weekend to ensure that it is increasing. DDx includes ectopic vs failed pregnancy vs threatened or incomplete abortion. Doubt septic abortion.  All questions were answered.  The patient is hemodynamically stable and in no acute distress.  Strict return precautions given.  She is safe for discharge home with outpatient follow-up at this time.  Final Clinical Impressions(s) / ED Diagnoses   Final diagnoses:  Pregnancy of unknown anatomic location  Abdominal cramping    ED Discharge Orders    None       Barkley Boards, PA-C 08/24/17 Wallace Going, MD 08/24/17 0025

## 2017-08-23 NOTE — Discharge Instructions (Signed)
Please a quantitative Hcg test re-checked by your doctor in 48-72 hours. Mostly likely it will be 72 hours since most offices are closed on Sunday.  Take 650 mg of Tylenol for pain control.  Please discuss your home medications with your doctor when you rechecked on Monday. Levetiracetam (Keppra) and levothyroxine are safe to take.   If you develop vaginal bleeding, worsening abdominal pain or cramping, or a fever, please return to the emergency department for reevaluation.

## 2017-08-23 NOTE — ED Triage Notes (Signed)
Pt reports that she was being seen at the [facility] today for abdominal cramping and they sent her here for an ultrasound. She recently found out that she was pregnant and she needs to know how far along she is.

## 2017-08-26 ENCOUNTER — Emergency Department (HOSPITAL_COMMUNITY): Payer: No Typology Code available for payment source

## 2017-08-26 ENCOUNTER — Emergency Department (HOSPITAL_COMMUNITY)
Admission: EM | Admit: 2017-08-26 | Discharge: 2017-08-27 | Disposition: A | Discharge status: Home / Self Care | Payer: No Typology Code available for payment source | Attending: Emergency Medicine | Admitting: Emergency Medicine

## 2017-08-26 ENCOUNTER — Encounter (HOSPITAL_COMMUNITY): Payer: Self-pay | Admitting: *Deleted

## 2017-08-26 DIAGNOSIS — Z3A00 Weeks of gestation of pregnancy not specified: Secondary | ICD-10-CM | POA: Insufficient documentation

## 2017-08-26 DIAGNOSIS — Z3491 Encounter for supervision of normal pregnancy, unspecified, first trimester: Secondary | ICD-10-CM

## 2017-08-26 DIAGNOSIS — O99280 Endocrine, nutritional and metabolic diseases complicating pregnancy, unspecified trimester: Secondary | ICD-10-CM | POA: Insufficient documentation

## 2017-08-26 DIAGNOSIS — O26859 Spotting complicating pregnancy, unspecified trimester: Secondary | ICD-10-CM | POA: Insufficient documentation

## 2017-08-26 DIAGNOSIS — E039 Hypothyroidism, unspecified: Secondary | ICD-10-CM | POA: Insufficient documentation

## 2017-08-26 DIAGNOSIS — Z79899 Other long term (current) drug therapy: Secondary | ICD-10-CM | POA: Insufficient documentation

## 2017-08-26 LAB — CBC WITH DIFFERENTIAL/PLATELET
Basophils Absolute: 0 10*3/uL (ref 0.0–0.1)
Basophils Relative: 0 %
EOS PCT: 2 %
Eosinophils Absolute: 0.3 10*3/uL (ref 0.0–0.7)
HEMATOCRIT: 39.9 % (ref 36.0–46.0)
Hemoglobin: 13.3 g/dL (ref 12.0–15.0)
LYMPHS ABS: 3.3 10*3/uL (ref 0.7–4.0)
LYMPHS PCT: 29 %
MCH: 32.2 pg (ref 26.0–34.0)
MCHC: 33.3 g/dL (ref 30.0–36.0)
MCV: 96.6 fL (ref 78.0–100.0)
MONO ABS: 0.7 10*3/uL (ref 0.1–1.0)
Monocytes Relative: 6 %
NEUTROS ABS: 7 10*3/uL (ref 1.7–7.7)
Neutrophils Relative %: 63 %
PLATELETS: 325 10*3/uL (ref 150–400)
RBC: 4.13 MIL/uL (ref 3.87–5.11)
RDW: 12.9 % (ref 11.5–15.5)
WBC: 11.3 10*3/uL — ABNORMAL HIGH (ref 4.0–10.5)

## 2017-08-26 LAB — COMPREHENSIVE METABOLIC PANEL
ALT: 28 U/L (ref 14–54)
AST: 19 U/L (ref 15–41)
Albumin: 4.1 g/dL (ref 3.5–5.0)
Alkaline Phosphatase: 42 U/L (ref 38–126)
Anion gap: 9 (ref 5–15)
BUN: 9 mg/dL (ref 6–20)
CHLORIDE: 105 mmol/L (ref 101–111)
CO2: 23 mmol/L (ref 22–32)
Calcium: 9.2 mg/dL (ref 8.9–10.3)
Creatinine, Ser: 0.53 mg/dL (ref 0.44–1.00)
Glucose, Bld: 95 mg/dL (ref 65–99)
POTASSIUM: 3.6 mmol/L (ref 3.5–5.1)
Sodium: 137 mmol/L (ref 135–145)
Total Bilirubin: 0.4 mg/dL (ref 0.3–1.2)
Total Protein: 7.4 g/dL (ref 6.5–8.1)

## 2017-08-26 LAB — HCG, QUANTITATIVE, PREGNANCY: HCG, BETA CHAIN, QUANT, S: 5613 m[IU]/mL — AB (ref <5)

## 2017-08-26 NOTE — ED Notes (Signed)
Patient is having ultrasound performed.  

## 2017-08-26 NOTE — ED Provider Notes (Signed)
[facility]-EMERGENCY DEPT Provider Note   CSN: [id number] Arrival date & time: 08/26/17  1739     History   Chief Complaint Chief Complaint  Patient presents with  . need repeat blood test    hCG    HPI   Blood pressure 105/75, pulse 94, temperature 98 F (36.7 C), temperature source Oral, resp. rate 18, SpO2 96 %.  Melanie Cervantes is a 37 y.o. female with PMH having of her ADHD and hypothyroid G3 presenting for recheck of beta hCG, she was seen on the 08/23/2017 for vaginal bleeding, ultrasound did not reveal an ectopic or intrauterine pregnancy.  She had some spotting and bilateral lower abdominal pain which she is been taking acetaminophen at home for with little relief, she states that when she turns it hurts more on the left side than the right side there is been no palpitations, shortness of breath, syncope.  She states that she follows at the [facility] and that the [facility] has requested the ED documentation of pregnancy before they would refer her to a OB/GYN.   Past Medical History:  Diagnosis Date  . ADHD (attention deficit hyperactivity disorder)   . Hypothyroid     Patient Active Problem List   Diagnosis Date Noted  . UNSPEC SYMPTOM ASSOC W/FEMALE GENITAL ORGANS 07/16/2007  . ASCUS PAP 07/16/2007  . GOITER NOS 07/18/2006    Past Surgical History:  Procedure Laterality Date  . ADENOIDECTOMY    . CESAREAN SECTION    . SEPTOPLASTY    . TONSILLECTOMY       OB History   None      Home Medications    Prior to Admission medications   Medication Sig Start Date End Date Taking? Authorizing Provider  levETIRAcetam (KEPPRA) 250 MG tablet Take 500 mg by mouth 3 (three) times daily.    [provider]  levothyroxine (SYNTHROID, LEVOTHROID) 25 MCG tablet Take 25 mcg by mouth daily before breakfast.    [provider]  Vilazodone HCl 20 MG TABS Take 40 tablets by mouth daily.    [provider]    Family History No family  history on file.  Social History Social History   Tobacco Use  . Smoking status: Never Smoker  . Smokeless tobacco: Never Used  Substance Use Topics  . Alcohol use: No  . Drug use: No     Allergies   Patient has no known allergies.   Review of Systems Review of Systems  A complete review of systems was obtained and all systems are negative except as noted in the HPI and PMH.   Physical Exam Updated Vital Signs BP 105/75 (BP Location: Left Arm)   Pulse 94   Temp 98 F (36.7 C) (Oral)   Resp 18   SpO2 96%   Physical Exam  Constitutional: She is oriented to person, place, and time. She appears well-developed and well-nourished. No distress.  HENT:  Head: Normocephalic and atraumatic.  Mouth/Throat: Oropharynx is clear and moist.  Eyes: Pupils are equal, round, and reactive to light. Conjunctivae and EOM are normal.  Neck: Normal range of motion.  Cardiovascular: Normal rate, regular rhythm and intact distal pulses.  Pulmonary/Chest: Effort normal and breath sounds normal.  Abdominal: Soft. She exhibits no distension and no mass. There is tenderness. There is no rebound and no guarding. No hernia.  Mild tenderness to palpation in the bilateral lower quadrants with no guarding or rebound.  Musculoskeletal: Normal range of motion.  Neurological:  She is alert and oriented to person, place, and time.  Skin: She is not diaphoretic.  Psychiatric: She has a normal mood and affect.  Nursing note and vitals reviewed.    ED Treatments / Results  Labs (all labs ordered are listed, but only abnormal results are displayed) Labs Reviewed  HCG, QUANTITATIVE, PREGNANCY - Abnormal; Notable for the following components:      Result Value   hCG, Beta Chain, Quant, S 5,613 (*)    All other components within normal limits  CBC WITH DIFFERENTIAL/PLATELET - Abnormal; Notable for the following components:   WBC 11.3 (*)    All other components within normal limits  COMPREHENSIVE  METABOLIC PANEL    EKG None  Radiology No results found.  Procedures Procedures (including critical care time)  Medications Ordered in ED Medications - No data to display   Initial Impression / Assessment and Plan / ED Course  I have reviewed the triage vital signs and the nursing notes.  Pertinent labs & imaging results that were available during my care of the patient were reviewed by me and considered in my medical decision making (see chart for details).     Vitals:   08/26/17 1758  BP: 105/75  Pulse: 94  Resp: 18  Temp: 98 F (36.7 C)  TempSrc: Oral  SpO2: 96%    Medications - No data to display  Melanie Cervantes is 37 y.o. female presenting with persistent lower abdominal pain she states that sometimes is worse on the left than the right.  She was seen several days ago and had a beta hCG which was 2900, there was no IUP or ectopic on ultrasound.  She did have some vaginal spotting but this is resolved over the course of the last day.  No palpitations, syncope, significantly increasing abdominal pain.  Her beta hCG has doubled however, given her persistent abdominal pain will discuss with OB/GYN.  OB/GYN consult from Dr. Erin Fulling appreciated: States that this is ectopic until proven otherwise, we will need to reimage and if no IUP is seen she will need to be transferred to [facility] for management.  Patient advised to remain n.p.o., basic blood work pending, reimaging ordered.  Case signed out to PA up still at shift change: Plan is to follow-up ultrasound, if there is not a clear IUP she is to be transferred to [facility] for evaluation of possible ectopic.    Final Clinical Impressions(s) / ED Diagnoses   Final diagnoses:  None    ED Discharge Orders    None       Loeta Herst, Mardella Layman 08/27/17 Melanee Spry, MD 08/29/17 1150

## 2017-08-26 NOTE — ED Triage Notes (Signed)
Pt here to have repeat hCG test performed. Pt had elevated hCG 3 days ago and was told to come back for repeat.

## 2017-08-26 NOTE — ED Provider Notes (Signed)
Here for recheck of pregnancy Here for repeat quant - rising appropriately Neg US 3 days ago Per OBGYN getting repeat to eval for IUP If no definite IUP, ectopic until otherwise proven - SEND TO [facility]  Ultrasound results confirm IUP, early. Recommends follow up for serial quants and repeat ultrasound in 14 days to assess viability.   The patient can be discharged home per plan of previous treatment team. Patient updated on results and plan.     Melanie Cervantes, Melanie Chojnowski, PA-C 08/27/17 0200    Benjiman CorePickering, Nathan, MD 08/29/17 1150

## 2017-08-26 NOTE — ED Notes (Signed)
Bed: WTR7 Expected date:  Expected time:  Means of arrival:  Comments: 

## 2017-08-27 NOTE — Discharge Instructions (Addendum)
Your ultrasound confirms an intrauterine pregnancy. Follow up with your OB for further prenatal care and recheck of abdominal discomfort. If you have any worsening pain, vaginal bleeding, or concerns felt related to the pregnancy, [facility] for [facility].

## 2017-09-09 ENCOUNTER — Encounter: Payer: Self-pay | Admitting: *Deleted

## 2017-09-12 ENCOUNTER — Inpatient Hospital Stay (HOSPITAL_COMMUNITY): Payer: No Typology Code available for payment source

## 2017-09-12 ENCOUNTER — Inpatient Hospital Stay (HOSPITAL_COMMUNITY)
Admission: AD | Admit: 2017-09-12 | Discharge: 2017-09-12 | Disposition: A | Discharge status: Home / Self Care | Payer: No Typology Code available for payment source | Source: Ambulatory Visit | Attending: Obstetrics & Gynecology | Admitting: Obstetrics & Gynecology

## 2017-09-12 ENCOUNTER — Encounter (HOSPITAL_COMMUNITY): Payer: Self-pay | Admitting: *Deleted

## 2017-09-12 DIAGNOSIS — R569 Unspecified convulsions: Secondary | ICD-10-CM | POA: Diagnosis not present

## 2017-09-12 DIAGNOSIS — O26899 Other specified pregnancy related conditions, unspecified trimester: Secondary | ICD-10-CM

## 2017-09-12 DIAGNOSIS — O99341 Other mental disorders complicating pregnancy, first trimester: Secondary | ICD-10-CM | POA: Insufficient documentation

## 2017-09-12 DIAGNOSIS — O99281 Endocrine, nutritional and metabolic diseases complicating pregnancy, first trimester: Secondary | ICD-10-CM | POA: Insufficient documentation

## 2017-09-12 DIAGNOSIS — Z79899 Other long term (current) drug therapy: Secondary | ICD-10-CM | POA: Diagnosis not present

## 2017-09-12 DIAGNOSIS — Z3491 Encounter for supervision of normal pregnancy, unspecified, first trimester: Secondary | ICD-10-CM

## 2017-09-12 DIAGNOSIS — Z3A01 Less than 8 weeks gestation of pregnancy: Secondary | ICD-10-CM | POA: Insufficient documentation

## 2017-09-12 DIAGNOSIS — O99351 Diseases of the nervous system complicating pregnancy, first trimester: Secondary | ICD-10-CM | POA: Insufficient documentation

## 2017-09-12 DIAGNOSIS — O26891 Other specified pregnancy related conditions, first trimester: Secondary | ICD-10-CM | POA: Diagnosis present

## 2017-09-12 DIAGNOSIS — E039 Hypothyroidism, unspecified: Secondary | ICD-10-CM | POA: Diagnosis not present

## 2017-09-12 DIAGNOSIS — O09521 Supervision of elderly multigravida, first trimester: Secondary | ICD-10-CM | POA: Insufficient documentation

## 2017-09-12 DIAGNOSIS — R509 Fever, unspecified: Secondary | ICD-10-CM | POA: Insufficient documentation

## 2017-09-12 DIAGNOSIS — F909 Attention-deficit hyperactivity disorder, unspecified type: Secondary | ICD-10-CM | POA: Diagnosis not present

## 2017-09-12 DIAGNOSIS — R109 Unspecified abdominal pain: Secondary | ICD-10-CM

## 2017-09-12 DIAGNOSIS — N898 Other specified noninflammatory disorders of vagina: Secondary | ICD-10-CM | POA: Diagnosis present

## 2017-09-12 DIAGNOSIS — R1030 Lower abdominal pain, unspecified: Secondary | ICD-10-CM | POA: Diagnosis present

## 2017-09-12 HISTORY — DX: Endometriosis, unspecified: N80.9

## 2017-09-12 HISTORY — DX: Localization-related (focal) (partial) symptomatic epilepsy and epileptic syndromes with complex partial seizures, not intractable, without status epilepticus: G40.209

## 2017-09-12 LAB — URINALYSIS, ROUTINE W REFLEX MICROSCOPIC
BACTERIA UA: NONE SEEN
Bilirubin Urine: NEGATIVE
Glucose, UA: NEGATIVE mg/dL
HGB URINE DIPSTICK: NEGATIVE
Ketones, ur: NEGATIVE mg/dL
Leukocytes, UA: NEGATIVE
NITRITE: NEGATIVE
PROTEIN: 30 mg/dL — AB
Specific Gravity, Urine: 1.028 (ref 1.005–1.030)
pH: 6 (ref 5.0–8.0)

## 2017-09-12 LAB — CBC
HCT: 37.8 % (ref 36.0–46.0)
Hemoglobin: 13 g/dL (ref 12.0–15.0)
MCH: 32.5 pg (ref 26.0–34.0)
MCHC: 34.4 g/dL (ref 30.0–36.0)
MCV: 94.5 fL (ref 78.0–100.0)
Platelets: 280 10*3/uL (ref 150–400)
RBC: 4 MIL/uL (ref 3.87–5.11)
RDW: 12.8 % (ref 11.5–15.5)
WBC: 10.9 10*3/uL — AB (ref 4.0–10.5)

## 2017-09-12 LAB — WET PREP, GENITAL
CLUE CELLS WET PREP: NONE SEEN
Sperm: NONE SEEN
Trich, Wet Prep: NONE SEEN
Yeast Wet Prep HPF POC: NONE SEEN

## 2017-09-12 LAB — INFLUENZA PANEL BY PCR (TYPE A & B)
INFLAPCR: NEGATIVE
INFLBPCR: NEGATIVE

## 2017-09-12 LAB — HCG, QUANTITATIVE, PREGNANCY: hCG, Beta Chain, Quant, S: 53556 m[IU]/mL — ABNORMAL HIGH (ref <5)

## 2017-09-12 NOTE — MAU Note (Signed)
Pt stated she has been having chills off and on for about 2 weeks. Had a fever last night 101 took tylenol and it went away but came back this morning. Also c/o green vaginal discharge mixed with blood. Also c/o cramping on and off for several weeks.

## 2017-09-12 NOTE — MAU Provider Note (Signed)
History     CSN: [id number]  Arrival date and time: 09/12/17 1100   First Provider Initiated Contact with Patient 09/12/17 1221     Chief Complaint  Patient presents with  . Chills  . Fever   HPI Melanie Cervantes is a 37 y.o. [id number] at 6545w0d who presents with lower abdominal pain and vaginal discharge. She states for several weeks she has had lower abdominal cramping that she rates a 6/10 and tylenol helps relieve. She states she also has a mucous discharge. She states for the last 2 weeks she has had chills and fever at home. No fever today.   OB History    Gravida  4   Para  2   Term  2   Preterm      AB  1   Living  2     SAB  1   TAB      Ectopic      Multiple      Live Births              Past Medical History:  Diagnosis Date  . ADHD (attention deficit hyperactivity disorder)   . Complex partial seizure (HCC)   . Endometriosis determined by laparoscopy   . Hypothyroid     Past Surgical History:  Procedure Laterality Date  . ADENOIDECTOMY    . CESAREAN SECTION     C/S x 1  . laparoscopy    . SEPTOPLASTY    . TONSILLECTOMY      No family history on file.  Social History   Tobacco Use  . Smoking status: Never Smoker  . Smokeless tobacco: Never Used  Substance Use Topics  . Alcohol use: No  . Drug use: No    Allergies: No Known Allergies  Medications Prior to Admission  Medication Sig Dispense Refill Last Dose  . levETIRAcetam (KEPPRA) 250 MG tablet Take 500 mg by mouth 3 (three) times daily.   08/22/2017 at 1200  . levothyroxine (SYNTHROID, LEVOTHROID) 25 MCG tablet Take 25 mcg by mouth daily before breakfast.   08/23/2017 at Unknown time  . Vilazodone HCl 20 MG TABS Take 40 tablets by mouth daily.   08/22/2017 at Unknown time    Review of Systems  Constitutional: Positive for chills. Negative for fatigue and fever.  HENT: Positive for congestion.   Respiratory: Negative.  Negative for shortness of breath.   Cardiovascular:  Negative.  Negative for chest pain.  Gastrointestinal: Positive for abdominal pain. Negative for constipation, diarrhea, nausea and vomiting.  Genitourinary: Positive for vaginal discharge. Negative for dysuria.  Neurological: Negative.  Negative for dizziness and headaches.   Physical Exam   Blood pressure 117/73, pulse 84, temperature 98.4 F (36.9 C), resp. rate 18, height 5\' 5"  (1.651 m), weight 189 lb (85.7 kg), last menstrual period 08/01/2017.  Physical Exam  Nursing note and vitals reviewed. Constitutional: She is oriented to person, place, and time. She appears well-developed and well-nourished. No distress.  HENT:  Head: Normocephalic.  Eyes: Pupils are equal, round, and reactive to light.  Cardiovascular: Normal rate, regular rhythm and normal heart sounds.  Respiratory: Effort normal and breath sounds normal. No respiratory distress.  GI: Soft. Bowel sounds are normal. She exhibits no distension and no mass. There is no tenderness. There is no rebound and no guarding.  Neurological: She is alert and oriented to person, place, and time.  Skin: Skin is warm and dry.  Psychiatric: She has a normal mood and affect. Her  behavior is normal. Judgment and thought content normal.    MAU Course  Procedures Results for orders placed or performed during the hospital encounter of 09/12/17 (from the past 24 hour(s))  Urinalysis, Routine w reflex microscopic     Status: Abnormal   Collection Time: 09/12/17 11:30 AM  Result Value Ref Range   Color, Urine YELLOW YELLOW   APPearance HAZY (A) CLEAR   Specific Gravity, Urine 1.028 1.005 - 1.030   pH 6.0 5.0 - 8.0   Glucose, UA NEGATIVE NEGATIVE mg/dL   Hgb urine dipstick NEGATIVE NEGATIVE   Bilirubin Urine NEGATIVE NEGATIVE   Ketones, ur NEGATIVE NEGATIVE mg/dL   Protein, ur 30 (A) NEGATIVE mg/dL   Nitrite NEGATIVE NEGATIVE   Leukocytes, UA NEGATIVE NEGATIVE   RBC / HPF 0-5 0 - 5 RBC/hpf   WBC, UA 0-5 0 - 5 WBC/hpf   Bacteria, UA  NONE SEEN NONE SEEN   Squamous Epithelial / LPF 0-5 0 - 5   Mucus PRESENT   Influenza panel by PCR (type A & B)     Status: None   Collection Time: 09/12/17 12:10 PM  Result Value Ref Range   Influenza A By PCR NEGATIVE NEGATIVE   Influenza B By PCR NEGATIVE NEGATIVE  CBC     Status: Abnormal   Collection Time: 09/12/17 12:11 PM  Result Value Ref Range   WBC 10.9 (H) 4.0 - 10.5 K/uL   RBC 4.00 3.87 - 5.11 MIL/uL   Hemoglobin 13.0 12.0 - 15.0 g/dL   HCT 16.1 09.6 - 04.5 %   MCV 94.5 78.0 - 100.0 fL   MCH 32.5 26.0 - 34.0 pg   MCHC 34.4 30.0 - 36.0 g/dL   RDW 40.9 81.1 - 91.4 %   Platelets 280 150 - 400 K/uL  hCG, quantitative, pregnancy     Status: Abnormal   Collection Time: 09/12/17 12:11 PM  Result Value Ref Range   hCG, Beta Chain, Quant, S 53,556 (H) <5 mIU/mL  Wet prep, genital     Status: Abnormal   Collection Time: 09/12/17  1:21 PM  Result Value Ref Range   Yeast Wet Prep HPF POC NONE SEEN NONE SEEN   Trich, Wet Prep NONE SEEN NONE SEEN   Clue Cells Wet Prep HPF POC NONE SEEN NONE SEEN   WBC, Wet Prep HPF POC FEW (A) NONE SEEN   Sperm NONE SEEN    US Ob Transvaginal  Result Date: 09/12/2017 CLINICAL DATA:  Pain. Assess for dating. Quantitative beta HCG is pending. By LMP patient is 6 weeks 0 days. EDC by LMP is 05/08/2018. LMP was 08/01/2017. Patient is gravida 4 para 2 SAB 1. EXAM: TRANSVAGINAL OB ULTRASOUND TECHNIQUE: Transvaginal ultrasound was performed for complete evaluation of the gestation as well as the maternal uterus, adnexal regions, and pelvic cul-de-sac. COMPARISON:  08/26/2017 FINDINGS: Intrauterine gestational sac: Single Yolk sac:  Visualized. Embryo:  Visualized. Cardiac Activity: Visualized. Heart Rate: 159 bpm CRL:   15.4 mm   7 w 6 d                  [place] EDC: 04/25/2018 Subchorionic hemorrhage:  None visualized. Maternal uterus/adnexae: Normal appearance of both ovaries. No free pelvic fluid. IMPRESSION: 1. Single living intrauterine embryo measuring 7  weeks 6 days. 2. Dating by ultrasound is significantly different from clinical dating. 3. By today's exam, EDC is 04/25/2017. Electronically Signed   By: Norva Pavlov M.D.   On: 09/12/2017 13:05   MDM  UA CBC, HCG Influenza PCR Wet prep and gc/chlamydia US OB Transvaginal  Assessment and Plan   1. Normal intrauterine pregnancy on prenatal ultrasound in first trimester   2. Abdominal pain affecting pregnancy    -Discharge home in stable condition -First trimester precautions discussed -Patient advised to follow-up with OB of choice to start prenatal care, list given -Patient may return to MAU as needed or if her condition were to change or worsen  Rolm Bookbinder CNM 09/12/2017, 12:21 PM

## 2017-09-12 NOTE — Discharge Instructions (Signed)
[facility]  ° ° °[facility]       Phone: [phone] ° °[facility] Phone: [phone] ° °[facility] [place]  Phone: [phone] ° °[facility] [place]  Phone: [phone] ° °[facility] [place]  Phone: [phone] ° °[facility]       Phone: [phone] ° °[facility]    Phone: [phone]  ° °[facility]    Phone: [phone] ° °[facility]    Phone: [phone] ° °[facility]    Phone: [phone] ° °[facility]    Phone: [phone] ° °[facility]       Phone: [phone]  ° °[facility]  Phone: [phone] ° °[facility]    Phone: [phone] ° °[facility]   Phone: [phone] ° °[facility]      Phone: [phone] ° °[facility]    Phone: [phone] ° °Safe Medications in Pregnancy  ° °Acne: °Benzoyl Peroxide °Salicylic Acid ° °Backache/Headache: °Tylenol: 2 regular strength every 4 hours OR °             2 Extra strength every 6 hours ° °Colds/Coughs/Allergies: °Benadryl (alcohol free) 25 mg every 6 hours as needed °Breath right strips °Claritin °Cepacol throat lozenges °Chloraseptic throat spray °Cold-Eeze- up to three times per day °Cough drops, alcohol free °Flonase (by prescription only) °Guaifenesin °Mucinex °Robitussin DM (plain only, alcohol free) °Saline nasal spray/drops °Sudafed (pseudoephedrine) & Actifed ** use only after [redacted] weeks gestation and if you do not have high blood pressure °Tylenol °Vicks Vaporub °Zinc lozenges °Zyrtec  ° °Constipation: °Colace °Ducolax suppositories °Fleet enema °Glycerin suppositories °Metamucil °Milk of  magnesia °Miralax °Senokot °Smooth move tea ° °Diarrhea: °Kaopectate °Imodium A-D ° °*NO pepto Bismol ° °Hemorrhoids: °Anusol °Anusol HC °Preparation H °Tucks ° °Indigestion: °Tums °Maalox °Mylanta °Zantac  °Pepcid ° °Insomnia: °Benadryl (alcohol free) 25mg every 6 hours as needed °Tylenol PM °Unisom, no Gelcaps ° °Leg Cramps: °Tums °MagGel ° °Nausea/Vomiting:  °Bonine °Dramamine °Emetrol °Ginger extract °Sea bands °Meclizine  °Nausea medication to take during pregnancy:  °Unisom (doxylamine succinate 25 mg tablets) Take one tablet daily at bedtime. If symptoms are not adequately controlled, the dose can be increased to a maximum recommended dose of two tablets daily (1/2 tablet in the morning, 1/2 tablet mid-afternoon and one at bedtime). °Vitamin B6 100mg tablets. Take one tablet twice a day (up to 200 mg per day). ° °Skin Rashes: °Aveeno products °Benadryl cream or 25mg every 6 hours as needed °Calamine Lotion °1% cortisone cream ° °Yeast infection: °Gyne-lotrimin 7 °Monistat 7 ° ° °**If taking multiple medications, please check labels to avoid duplicating the same active ingredients °**take medication as directed on the label °** Do not exceed 4000 mg of tylenol in 24 hours °**Do not take medications that contain aspirin or ibuprofen ° ° ° ° ° °Abdominal Pain During Pregnancy °Abdominal pain is common in pregnancy. Most of the time, it does not cause harm. There are many causes of abdominal pain. Some causes are more serious than others and sometimes the cause is not known. Abdominal pain can be a sign that something is very wrong with the pregnancy or the pain may have nothing to do with the pregnancy. Always tell your health care provider if you have any abdominal   pain. °Follow these instructions at home: °· Do not have sex or put anything in your vagina until your symptoms go away completely. °· Watch your abdominal pain for any changes. °· Get plenty of rest until your pain improves. °· Drink enough fluid to  keep your urine clear or pale yellow. °· Take over-the-counter or prescription medicines only as told by your health care provider. °· Keep all follow-up visits as told by your health care provider. This is important. °Contact a health care provider if: °· You have a fever. °· Your pain gets worse or you have cramping. °· Your pain continues after resting. °Get help right away if: °· You are bleeding, leaking fluid, or passing tissue from the vagina. °· You have vomiting or diarrhea that does not go away. °· You have painful or bloody urination. °· You notice a decrease in your baby's movements. °· You feel very weak or faint. °· You have shortness of breath. °· You develop a severe headache with abdominal pain. °· You have abnormal vaginal discharge with abdominal pain. °This information is not intended to replace advice given to you by your health care provider. Make sure you discuss any questions you have with your health care provider. °Document Released: 05/07/2005 Document Revised: 02/16/2016 Document Reviewed: 12/04/2012 °Elsevier Interactive Patient Education © 2018 [employer] ° °

## 2017-09-13 LAB — GC/CHLAMYDIA PROBE AMP (~~LOC~~) NOT AT ARMC
CHLAMYDIA, DNA PROBE: NEGATIVE
NEISSERIA GONORRHEA: NEGATIVE

## 2017-09-19 ENCOUNTER — Encounter: Payer: Self-pay | Admitting: *Deleted

## 2017-09-23 ENCOUNTER — Encounter: Payer: Self-pay | Admitting: *Deleted

## 2017-10-17 ENCOUNTER — Encounter: Payer: No Typology Code available for payment source | Admitting: Student

## 2018-07-18 ENCOUNTER — Encounter (HOSPITAL_COMMUNITY): Payer: Self-pay

## 2018-09-20 IMAGING — US US OB COMP LESS 14 WK
1 series · 14 of 28 positions shown · non-contrast
Comparison: 07/18/2007 pelvic ultrasound

CLINICAL DATA: Pelvic cramping

EXAM:
OBSTETRIC <14 WK US AND TRANSVAGINAL OB US
TECHNIQUE: Both transabdominal and transvaginal ultrasound examinations were
performed for complete evaluation of the gestation as well as the
maternal uterus, adnexal regions, and pelvic cul-de-sac.
Transvaginal technique was performed to assess early pregnancy.

[Series 1: us ob comp less 14 wk · 14 of 48 slices shown]
[im 2/48]
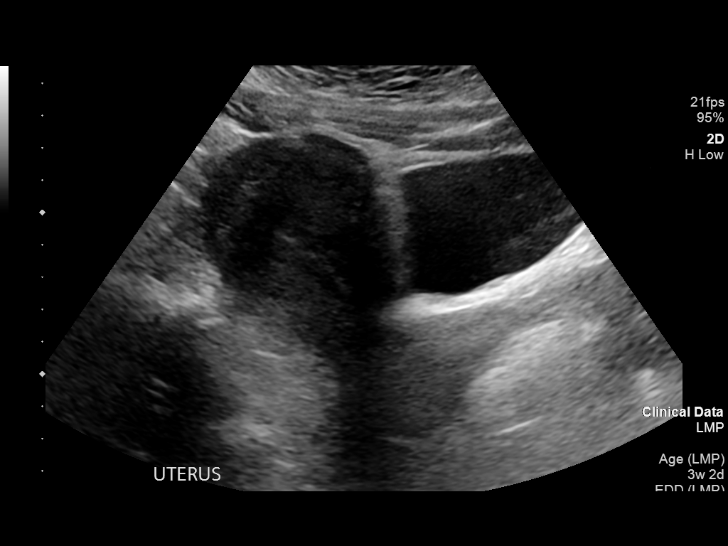
[im 6/48]
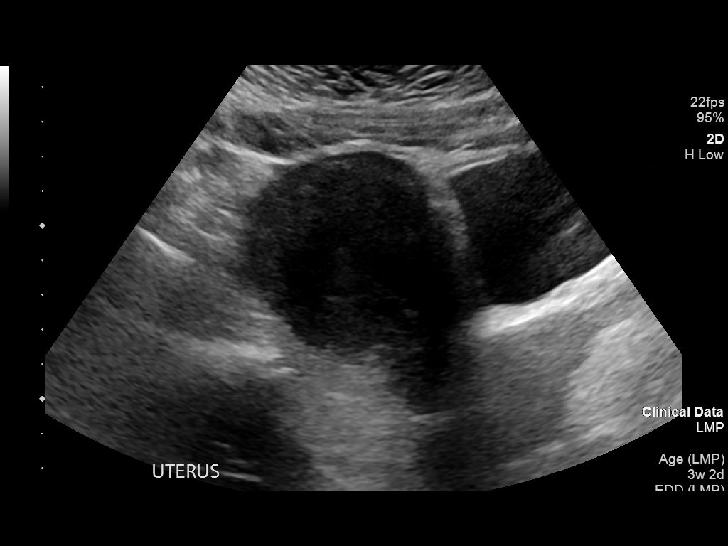
[im 9/48]
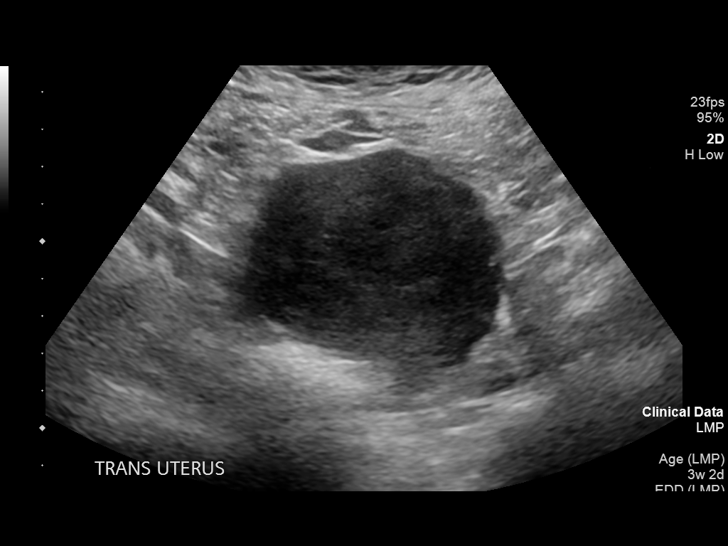
[im 13/48]
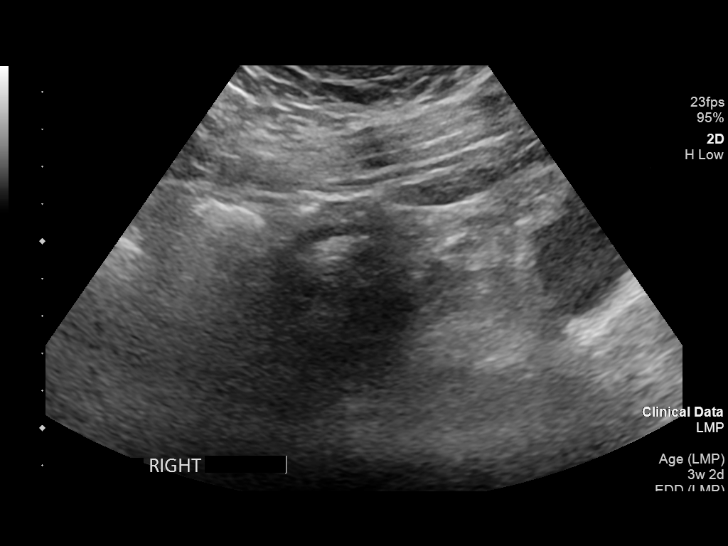
[im 16/48]
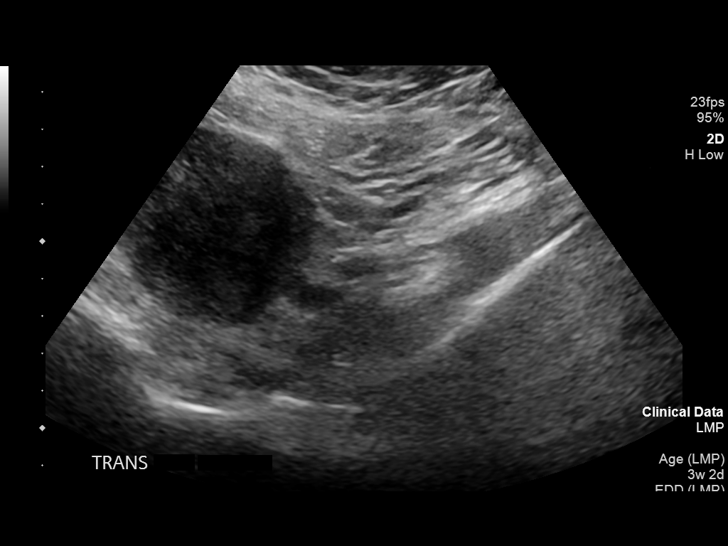
[im 20/48]
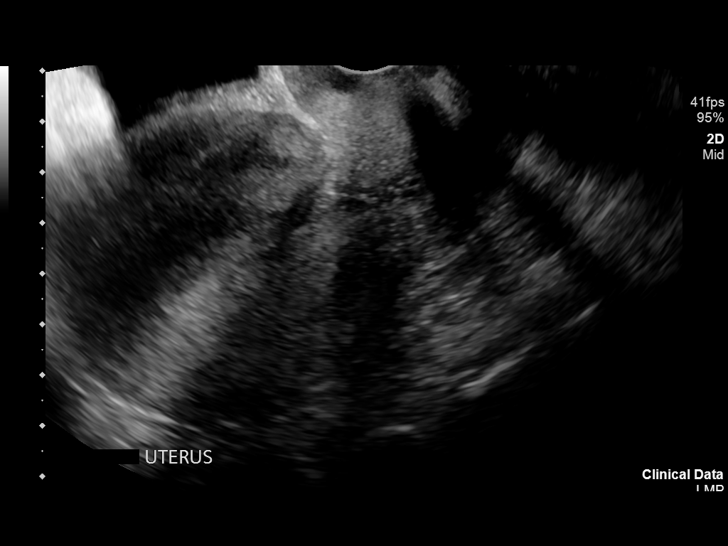
[im 23/48]
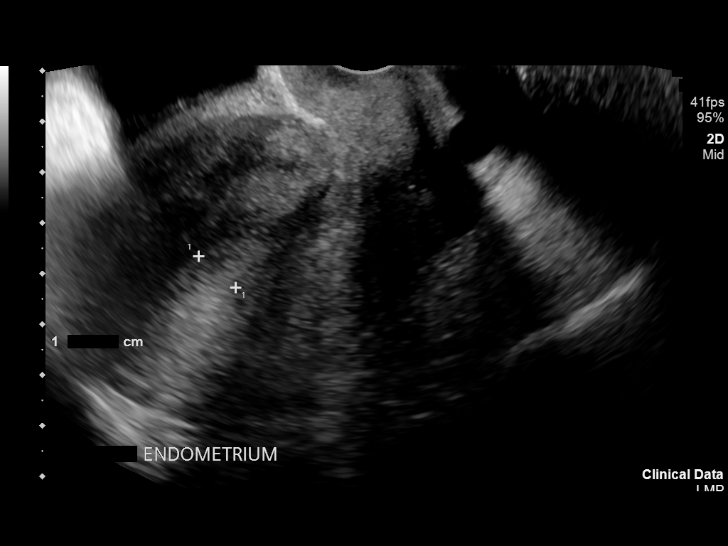
[im 27/48]
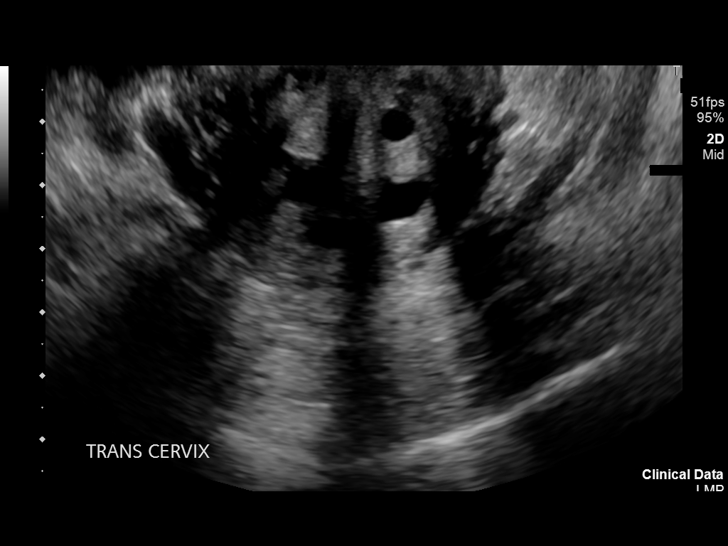
[im 30/48]
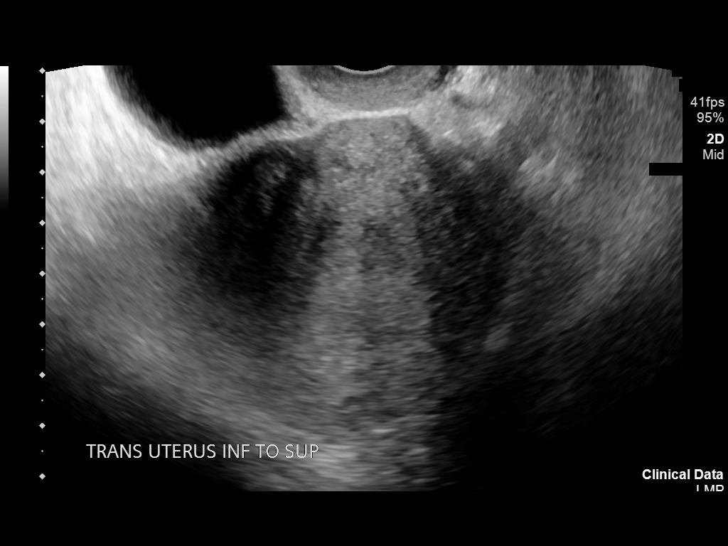
[im 34/48]
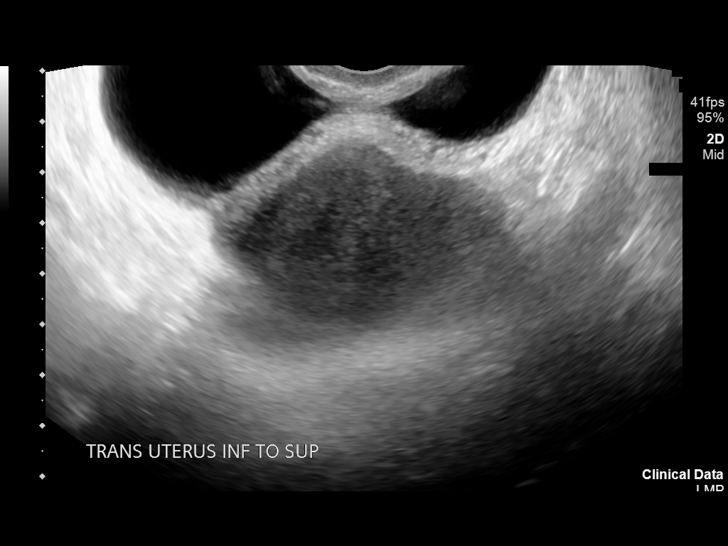
[im 37/48]
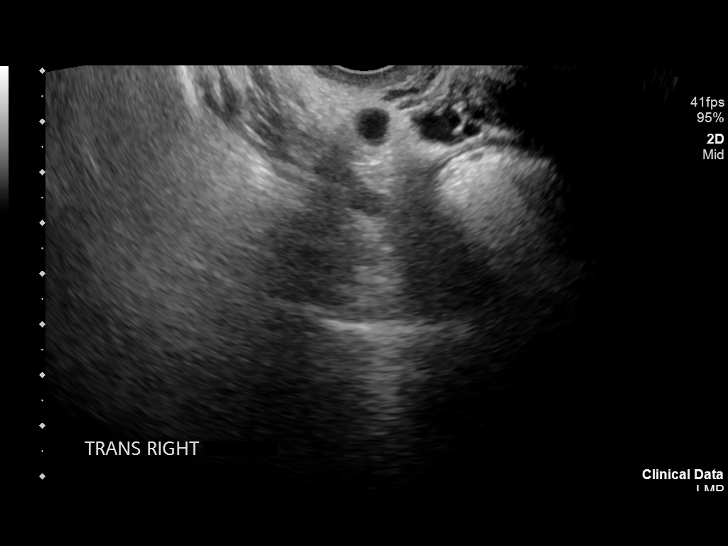
[im 41/48]
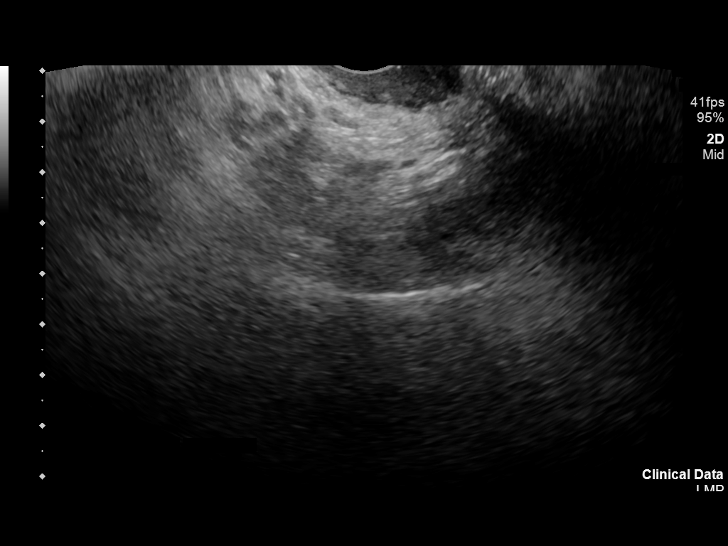
[im 44/48]
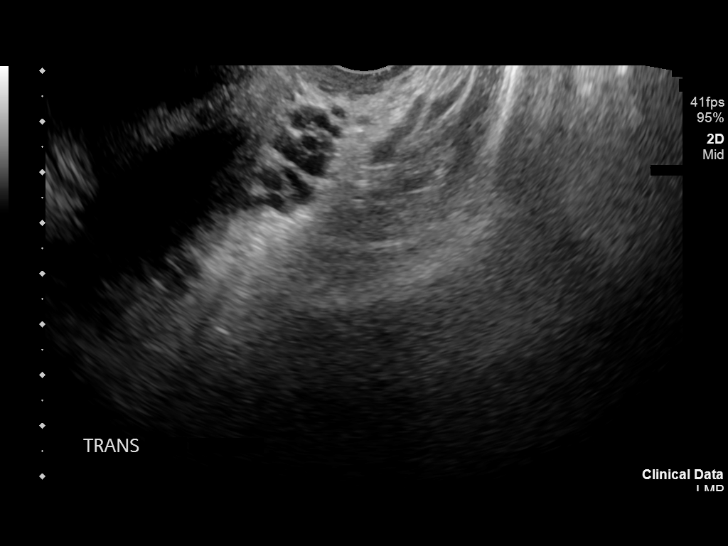
[im 48/48]
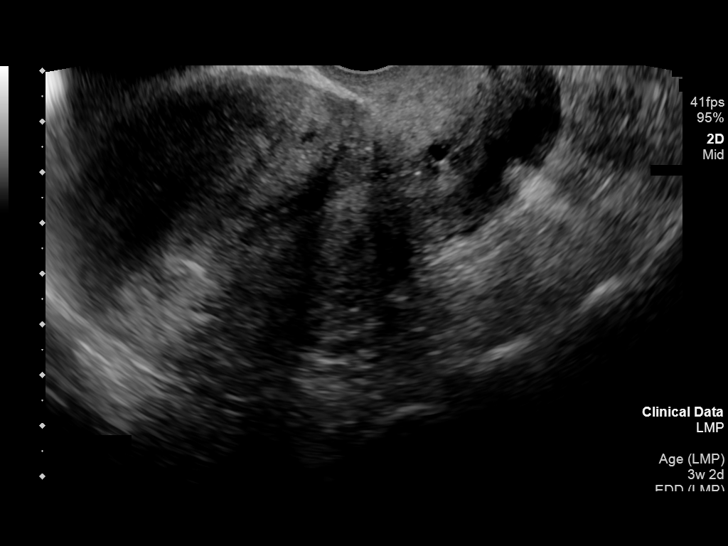

[14 of 28 positions shown; findings below may reference images not displayed]

FINDINGS: Intrauterine gestational sac: None

Yolk sac:  Not Visualized.

Embryo:  Not Visualized.

Cardiac Activity: Not Visualized.

Heart Rate: Not applicable

MSD: Not applicable

CRL: No fetal pole noted.

Subchorionic hemorrhage:  None visualized.

Maternal uterus/adnexae: Neither ovary was visualized. The uterus
measured 10.4 x 6.2 x 7 cm and is free of focal mass. Endometrial
lining is 9.6 mm in thickness. No ectopic pregnancy is visualized.
IMPRESSION: No findings for the patient's pelvic cramping. Intrauterine or
ectopic pregnancy is noted. The patient is hCG level is currently
pending and if positive, findings may represent a pregnancy of
unknown location for which serial HCG and follow-up ultrasound would
be recommended.

## 2018-09-23 IMAGING — US US OB TRANSVAGINAL
1 series · 13 of 28 positions shown · non-contrast
Comparison: Prior ultrasound from 08/23/2017.

CLINICAL DATA: Initial evaluation for pelvic pain, early pregnancy
with rising beta HCG.

EXAM:
OBSTETRIC <14 WK US AND TRANSVAGINAL OB US
TECHNIQUE: Both transabdominal and transvaginal ultrasound examinations were
performed for complete evaluation of the gestation as well as the
maternal uterus, adnexal regions, and pelvic cul-de-sac.
Transvaginal technique was performed to assess early pregnancy.

[Series 1: us ob transvaginal · 68 acquisitions, 13 frames shown]
[im 3/68]
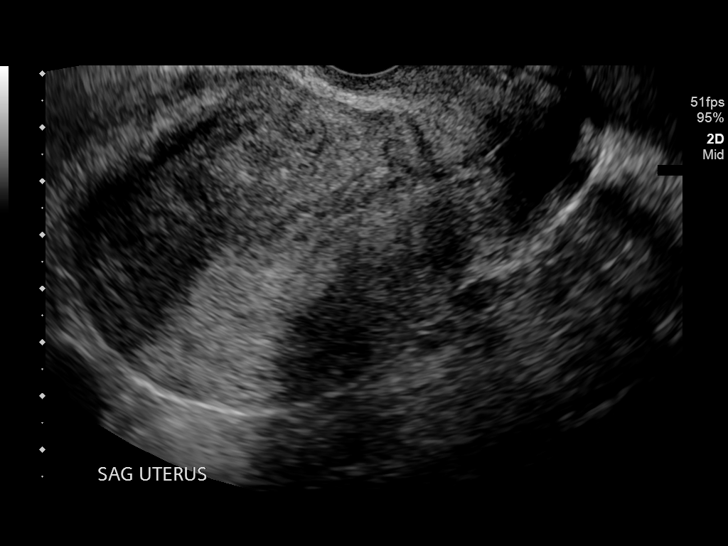
[im 8/68]
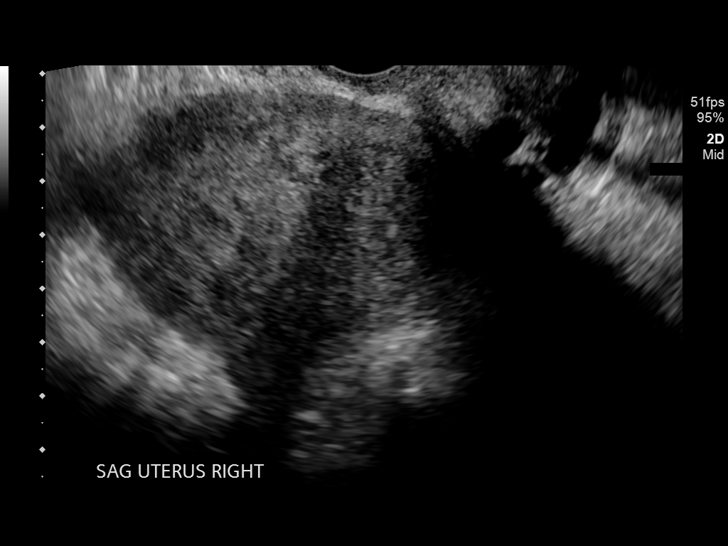
[im 13/68]
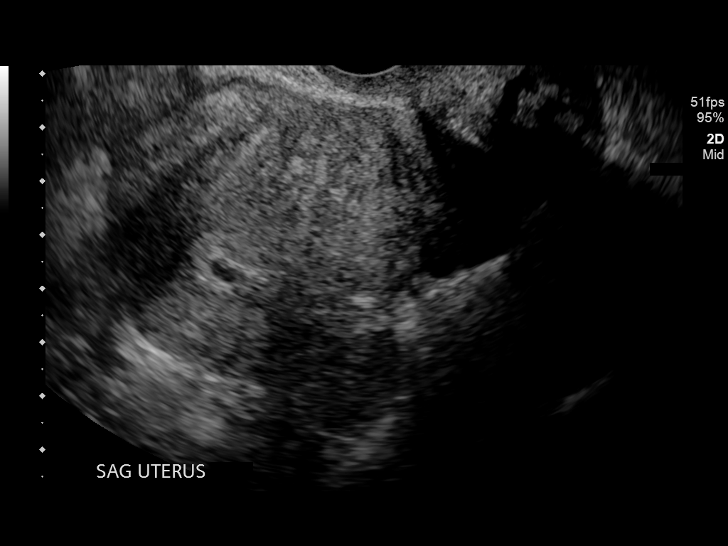
[im 18/68]
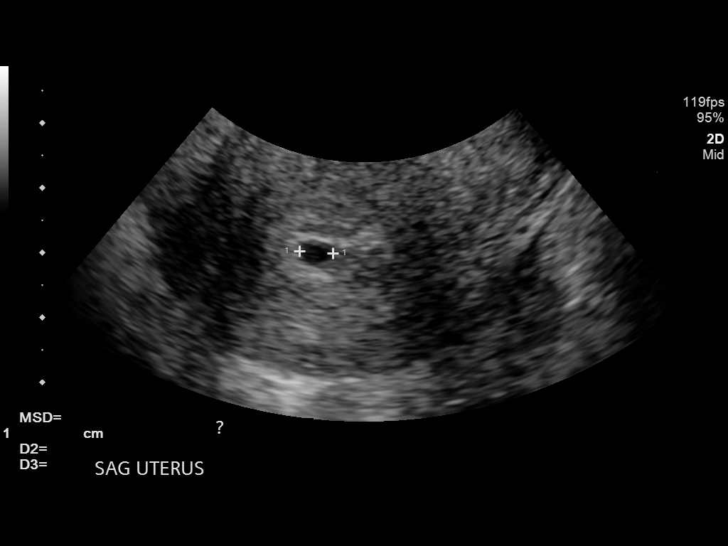
[im 23/68]
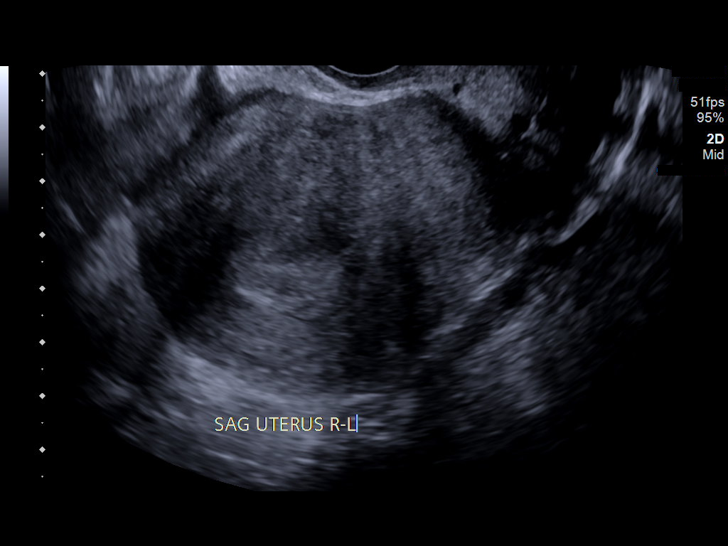
[im 28/68]
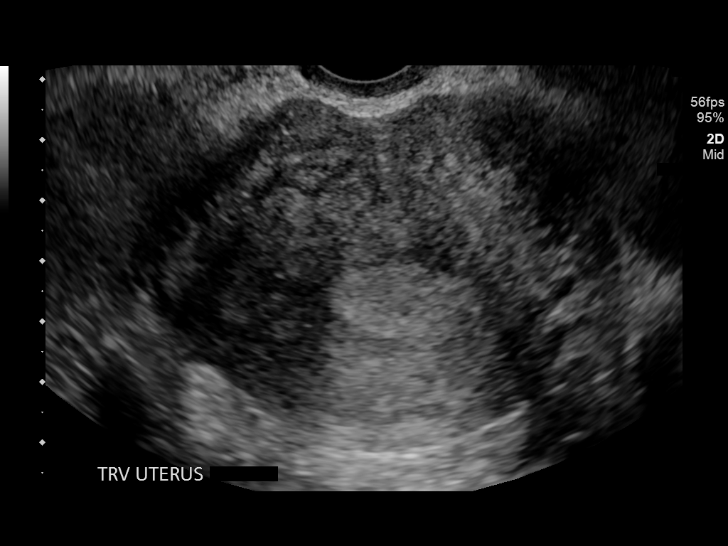
[im 35/68]
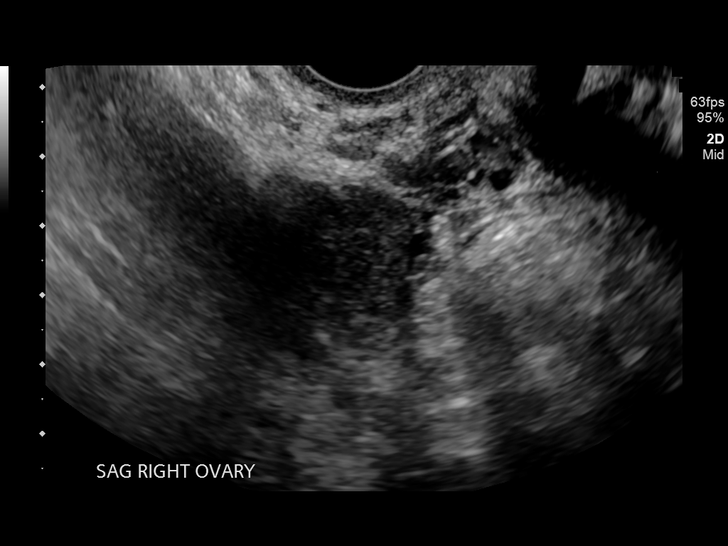
[im 40/68]
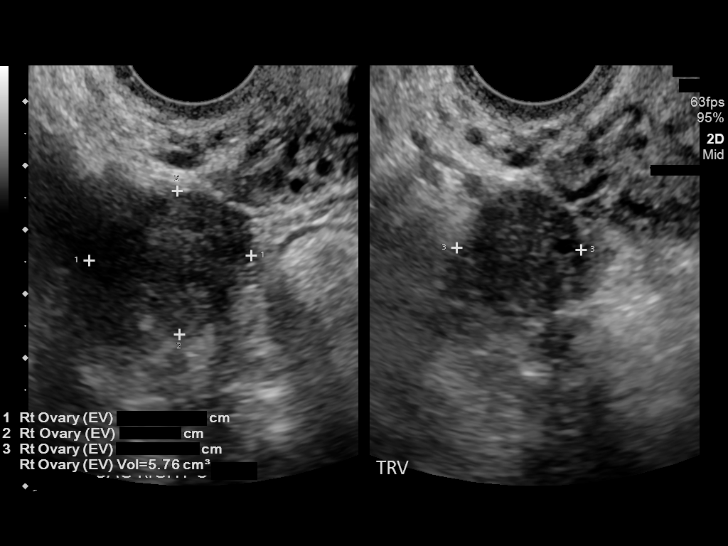
[im 45/68]
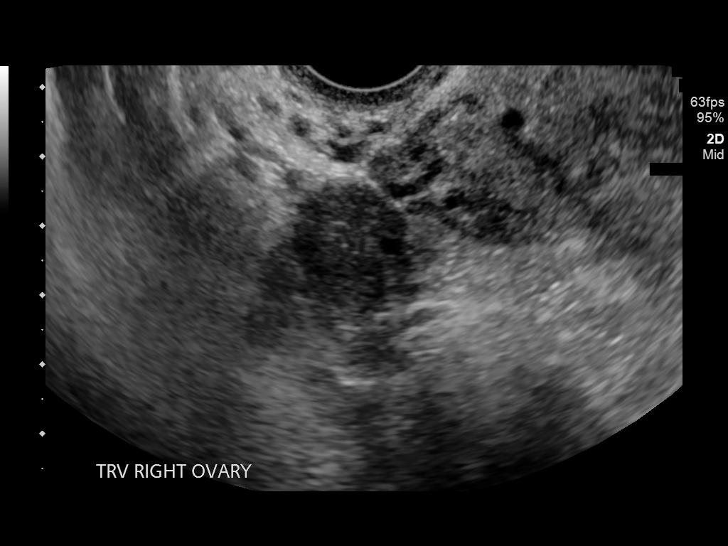
[im 50/68]
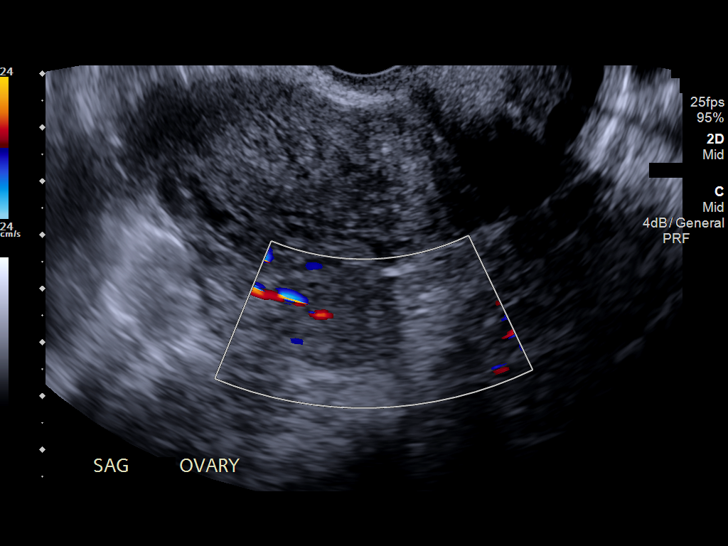
[im 55/68]
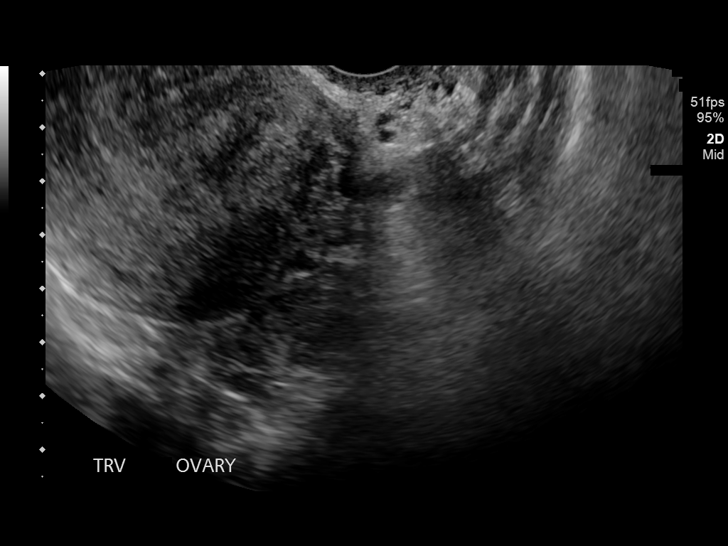
[im 60/68]
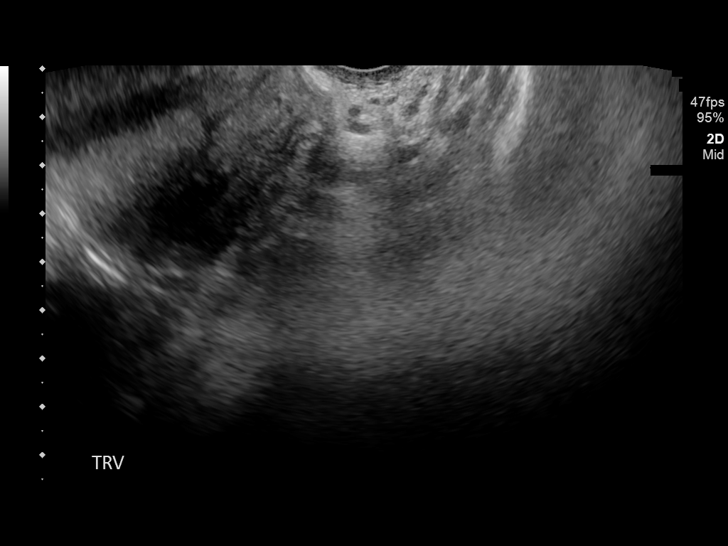
[im 65/68]
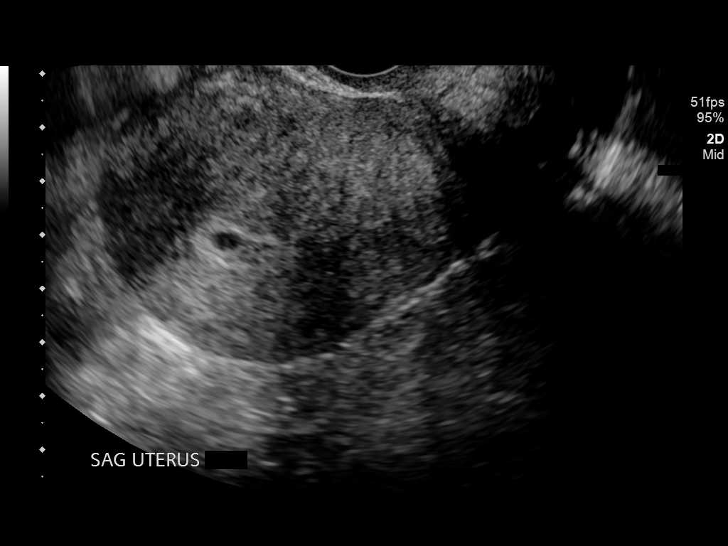

[13 of 28 positions shown; findings below may reference images not displayed]

FINDINGS: Intrauterine gestational sac: Single

Yolk sac:  Not visualized.

Embryo:  Not visualized.

Cardiac Activity: N/A

Heart Rate: N/A  bpm

MSD: 5.1 mm   5 w   2 d

Subchorionic hemorrhage:  None visualized.

Maternal uterus/adnexae: Ovaries are normal in appearance
bilaterally. No adnexal mass. No free fluid.
IMPRESSION: 1. Probable early intrauterine gestational sac, but no yolk sac,
fetal pole, or cardiac activity yet visualized. Recommend follow-up
quantitative B-HCG levels and follow-up US in 14 days to assess
viability. This recommendation follows SRU consensus guidelines:
Diagnostic Criteria for Nonviable Pregnancy Early in the First
Trimester. N Engl J Med 7438; [DATE].
2. No other acute maternal uterine or adnexal abnormality
identified.

## 2019-07-29 ENCOUNTER — Ambulatory Visit (HOSPITAL_COMMUNITY)
Admission: EM | Admit: 2019-07-29 | Discharge: 2019-07-29 | Disposition: A | Discharge status: Home / Self Care | Payer: No Typology Code available for payment source | Attending: Internal Medicine | Admitting: Internal Medicine

## 2019-07-29 ENCOUNTER — Other Ambulatory Visit: Payer: Self-pay

## 2019-07-29 ENCOUNTER — Encounter (HOSPITAL_COMMUNITY): Payer: Self-pay

## 2019-07-29 DIAGNOSIS — S70312A Abrasion, left thigh, initial encounter: Secondary | ICD-10-CM

## 2019-07-29 DIAGNOSIS — W5532XA Struck by other hoof stock, initial encounter: Secondary | ICD-10-CM | POA: Diagnosis not present

## 2019-07-29 DIAGNOSIS — S7011XA Contusion of right thigh, initial encounter: Secondary | ICD-10-CM

## 2019-07-29 DIAGNOSIS — S71109A Unspecified open wound, unspecified thigh, initial encounter: Secondary | ICD-10-CM

## 2019-07-29 MED ORDER — BACITRACIN ZINC 500 UNIT/GM EX OINT: 1.0000 "application " | TOPICAL_OINTMENT | Freq: Two times a day (BID) | CUTANEOUS | 0 refills | Status: AC

## 2019-07-29 MED ORDER — ACETAMINOPHEN 500 MG PO TABS: 500.0000 mg | ORAL_TABLET | Freq: Four times a day (QID) | ORAL | 0 refills | Status: AC | PRN

## 2019-07-29 NOTE — ED Triage Notes (Signed)
Pt state she was struk by a goat. Pt states her left thigh was puncture by the horn of the goat.

## 2019-07-29 NOTE — ED Provider Notes (Signed)
[facility]    CSN: [id number] Arrival date & time: 07/29/19  1130      History   Chief Complaint Chief Complaint  Patient presents with  . Wound Check    HPI Melanie Cervantes is a 39 y.o. female comes to urgent care for right thigh pain and left thigh abrasion after she was struck by a goat.  Patient was trying to feed the goat when she was admitted by the group.  She sustained an abrasion on the right thigh and moderate persistent throbbing pain on the left thigh.  No known aggravating or relieving factors.  No numbness or tingling in the lower extremities.Patient has no tried any over-the-counter medications.  HPI  Past Medical History:  Diagnosis Date  . ADHD (attention deficit hyperactivity disorder)   . Anxiety   . Complex partial seizure (HCC)   . Depression   . Endometriosis determined by laparoscopy   . Headache   . Hypothyroid     Patient Active Problem List   Diagnosis Date Noted  . UNSPEC SYMPTOM ASSOC W/FEMALE GENITAL ORGANS 07/16/2007  . ASCUS PAP 07/16/2007  . GOITER NOS 07/18/2006    Past Surgical History:  Procedure Laterality Date  . ADENOIDECTOMY    . CESAREAN SECTION     C/S x 1  . laparoscopy    . SEPTOPLASTY    . TONSILLECTOMY      OB History    Gravida  4   Para  2   Term  2   Preterm      AB  1   Living  2     SAB  1   TAB      Ectopic      Multiple      Live Births               Home Medications    Prior to Admission medications   Medication Sig Start Date End Date Taking? Authorizing Provider  acetaminophen (TYLENOL) 500 MG tablet Take 1 tablet (500 mg total) by mouth every 6 (six) hours as needed. 07/29/19   Shaima Sardinas, Britta Mccreedy, MD  bacitracin ointment Apply 1 application topically 2 (two) times daily. 07/29/19   Merrilee Jansky, MD  levETIRAcetam (KEPPRA) 250 MG tablet Take 500 mg by mouth 3 (three) times daily.    [provider]  levothyroxine (SYNTHROID, LEVOTHROID) 25 MCG tablet  Take 25 mcg by mouth daily before breakfast.    [provider]    Family History History reviewed. No pertinent family history.  Social History Social History   Tobacco Use  . Smoking status: Never Smoker  . Smokeless tobacco: Never Used  Substance Use Topics  . Alcohol use: No  . Drug use: No     Allergies   Lexapro [escitalopram]   Review of Systems Review of Systems  Constitutional: Negative for activity change and unexpected weight change.  Musculoskeletal: Positive for arthralgias and myalgias. Negative for back pain and joint swelling.  Skin: Positive for wound. Negative for rash.  Psychiatric/Behavioral: Negative for confusion and decreased concentration.     Physical Exam Triage Vital Signs ED Triage Vitals  Enc Vitals Group     BP 07/29/19 1217 113/88     Pulse Rate 07/29/19 1217 90     Resp 07/29/19 1217 16     Temp 07/29/19 1217 98.3 F (36.8 C)     Temp Source 07/29/19 1217 Oral     SpO2 07/29/19 1217 98 %  Weight 07/29/19 1229 195 lb (88.5 kg)     Height --      Head Circumference --      Peak Flow --      Pain Score 07/29/19 1229 5     Pain Loc --      Pain Edu? --      Excl. in [place]? --    No data found.  Updated Vital Signs BP 113/88 (BP Location: Left Arm)   Pulse 90   Temp 98.3 F (36.8 C) (Oral)   Resp 16   Wt 88.5 kg   LMP 07/29/2019   SpO2 98%   BMI 32.45 kg/m   Visual Acuity Right Eye Distance:   Left Eye Distance:   Bilateral Distance:    Right Eye Near:   Left Eye Near:    Bilateral Near:     Physical Exam Vitals and nursing note reviewed.  Constitutional:      General: She is not in acute distress.    Appearance: She is not ill-appearing.  Skin:    Capillary Refill: Capillary refill takes less than 2 seconds.     Findings: Bruising present. No erythema.     Comments: Superficial abrasion on the left inner thigh.  No active bleeding.  No surrounding erythema.  Minimal bruising on the right inner  thigh.  Tenderness to palpation over the quadricep muscle.  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      UC Treatments / Results  Labs (all labs ordered are listed, but only abnormal results are displayed) Labs Reviewed - No data to display  EKG   Radiology No results found.  Procedures Procedures (including critical care time)  Medications Ordered in UC Medications - No data to display  Initial Impression / Assessment and Plan / UC Course  I have reviewed the triage vital signs and the nursing notes.  Pertinent labs & imaging results that were available during my care of the patient were reviewed by me and considered in my medical decision making (see chart for details).     1.  Superficial abrasion on the right: Local wound care with bacitracin ointment Patient was given return precautions if she notices increasing discharge, worsening erythema or swelling. Contusion of the right thigh will improve over time.  There is no indication of a femoral fracture based on physical exam and the fact the patient was able to ambulate without significant pain.  Final Clinical Impressions(s) / UC Diagnoses   Final diagnoses:  Wound of thigh  Contusion of right thigh, initial encounter   Discharge Instructions   None    ED Prescriptions    Medication Sig Dispense Auth. Provider   bacitracin ointment Apply 1 application topically 2 (two) times daily. 120 g Sriya Kroeze, Myrene Galas, MD   acetaminophen (TYLENOL) 500 MG tablet Take 1 tablet (500 mg total) by mouth every 6 (six) hours as needed. 30 tablet Fabio Wah, Myrene Galas, MD     PDMP not reviewed this encounter.   Chase Picket, MD 07/29/19 1355

## 2022-05-07 ENCOUNTER — Encounter (HOSPITAL_COMMUNITY): Payer: Self-pay

## 2022-05-07 ENCOUNTER — Ambulatory Visit (HOSPITAL_COMMUNITY)
Admission: EM | Admit: 2022-05-07 | Discharge: 2022-05-07 | Disposition: A | Discharge status: Home / Self Care | Payer: No Typology Code available for payment source | Attending: Physician Assistant | Admitting: Physician Assistant

## 2022-05-07 DIAGNOSIS — B3731 Acute candidiasis of vulva and vagina: Secondary | ICD-10-CM | POA: Insufficient documentation

## 2022-05-07 DIAGNOSIS — N898 Other specified noninflammatory disorders of vagina: Secondary | ICD-10-CM | POA: Diagnosis not present

## 2022-05-07 LAB — POCT URINALYSIS DIPSTICK, ED / UC
Bilirubin Urine: NEGATIVE
Glucose, UA: NEGATIVE mg/dL
Ketones, ur: NEGATIVE mg/dL
Leukocytes,Ua: NEGATIVE
Nitrite: NEGATIVE
Protein, ur: 30 mg/dL — AB
Specific Gravity, Urine: >=1.03 (ref 1.005–1.030)
Urobilinogen, UA: 0.2 mg/dL (ref 0.0–1.0)
pH: 6 (ref 5.0–8.0)

## 2022-05-07 LAB — POC URINE PREG, ED: Preg Test, Ur: NEGATIVE

## 2022-05-07 MED ORDER — FLUCONAZOLE 150 MG PO TABS: 150.0000 mg | ORAL_TABLET | Freq: Every day | ORAL | 0 refills | Status: AC

## 2022-05-07 NOTE — ED Provider Notes (Addendum)
[facility]    CSN: [id number] Arrival date & time: 05/07/22  1930      History   Chief Complaint Chief Complaint  Patient presents with   Vaginal Discharge    HPI Melanie Cervantes is a 41 y.o. female.   Patient complains of increased vaginal discharge and itching that started about 1 week ago.  She reports using over-the-counter Monistat with no relief.  She denies odor, dysuria, abdominal pain, pelvic pain, fever, chills.     Past Medical History:  Diagnosis Date   ADHD (attention deficit hyperactivity disorder)    Anxiety    Complex partial seizure (HCC)    Depression    Endometriosis determined by laparoscopy    Headache    Hypothyroid     Patient Active Problem List   Diagnosis Date Noted   UNSPEC SYMPTOM ASSOC W/FEMALE GENITAL ORGANS 07/16/2007   ASCUS PAP 07/16/2007   GOITER NOS 07/18/2006    Past Surgical History:  Procedure Laterality Date   ADENOIDECTOMY     CESAREAN SECTION     C/S x 1   laparoscopy     SEPTOPLASTY     TONSILLECTOMY      OB History     Gravida  4   Para  2   Term  2   Preterm      AB  1   Living  2      SAB  1   IAB      Ectopic      Multiple      Live Births               Home Medications    Prior to Admission medications   Medication Sig Start Date End Date Taking? Authorizing Provider  fluconazole (DIFLUCAN) 150 MG tablet Take 1 tablet (150 mg total) by mouth daily for 1 day. Take 1 tablet by mouth, if no improvement after 72 hours may take the second tablet. 05/07/22 05/08/22 Yes Ward, Tylene Fantasia, PA-C  acetaminophen (TYLENOL) 500 MG tablet Take 1 tablet (500 mg total) by mouth every 6 (six) hours as needed. 07/29/19   Lamptey, Britta Mccreedy, MD  bacitracin ointment Apply 1 application topically 2 (two) times daily. 07/29/19   Merrilee Jansky, MD  levETIRAcetam (KEPPRA) 250 MG tablet Take 500 mg by mouth 3 (three) times daily.    [provider]  levothyroxine (SYNTHROID,  LEVOTHROID) 25 MCG tablet Take 25 mcg by mouth daily before breakfast.    [provider]    Family History History reviewed. No pertinent family history.  Social History Social History   Tobacco Use   Smoking status: Never   Smokeless tobacco: Never  Substance Use Topics   Alcohol use: No   Drug use: No     Allergies   Lexapro [escitalopram]   Review of Systems Review of Systems  Constitutional:  Negative for chills and fever.  HENT:  Negative for ear pain and sore throat.   Eyes:  Negative for pain and visual disturbance.  Respiratory:  Negative for cough and shortness of breath.   Cardiovascular:  Negative for chest pain and palpitations.  Gastrointestinal:  Negative for abdominal pain and vomiting.  Genitourinary:  Positive for vaginal discharge. Negative for dysuria and hematuria.  Musculoskeletal:  Negative for arthralgias and back pain.  Skin:  Negative for color change and rash.  Neurological:  Negative for seizures and syncope.  All other systems reviewed and are negative.  Physical Exam Triage Vital Signs ED Triage Vitals [05/07/22 2003]  Enc Vitals Group     BP 123/89     Pulse Rate 83     Resp 16     Temp 98.3 F (36.8 C)     Temp Source Oral     SpO2 100 %     Weight      Height      Head Circumference      Peak Flow      Pain Score      Pain Loc      Pain Edu?      Excl. in GC?    No data found.  Updated Vital Signs BP 123/89 (BP Location: Left Arm)   Pulse 83   Temp 98.3 F (36.8 C) (Oral)   Resp 16   LMP 04/29/2022 (Approximate)   SpO2 100%   Visual Acuity Right Eye Distance:   Left Eye Distance:   Bilateral Distance:    Right Eye Near:   Left Eye Near:    Bilateral Near:     Physical Exam Vitals and nursing note reviewed.  Constitutional:      General: She is not in acute distress.    Appearance: She is well-developed.  HENT:     Head: Normocephalic and atraumatic.  Eyes:     Conjunctiva/sclera:  Conjunctivae normal.  Cardiovascular:     Rate and Rhythm: Normal rate and regular rhythm.     Heart sounds: No murmur heard. Pulmonary:     Effort: Pulmonary effort is normal. No respiratory distress.     Breath sounds: Normal breath sounds.  Abdominal:     Palpations: Abdomen is soft.     Tenderness: There is no abdominal tenderness.  Musculoskeletal:        General: No swelling.     Cervical back: Neck supple.  Skin:    General: Skin is warm and dry.     Capillary Refill: Capillary refill takes less than 2 seconds.  Neurological:     Mental Status: She is alert.  Psychiatric:        Mood and Affect: Mood normal.      UC Treatments / Results  Labs (all labs ordered are listed, but only abnormal results are displayed) Labs Reviewed  POCT URINALYSIS DIPSTICK, ED / UC - Abnormal; Notable for the following components:      Result Value   Hgb urine dipstick TRACE (*)    Protein, ur 30 (*)    All other components within normal limits  POC URINE PREG, ED  CERVICOVAGINAL ANCILLARY ONLY    EKG   Radiology No results found.  Procedures Procedures (including critical care time)  Medications Ordered in UC Medications - No data to display  Initial Impression / Assessment and Plan / UC Course  I have reviewed the triage vital signs and the nursing notes.  Pertinent labs & imaging results that were available during my care of the patient were reviewed by me and considered in my medical decision making (see chart for details).     Will treat for vaginal yeast.  Cervicovaginal self swab done in clinic today.  Will change treatment plan if indicated based on results.  UA normal.  Urine pregnancy negative in clinic today. Return precautions discussed. Final Clinical Impressions(s) / UC Diagnoses   Final diagnoses:  Vaginal yeast infection     Discharge Instructions      Take Diflucan as prescribed. Will call with test results  and change treatment plan if  indicated. Return if you develop new or worsening symptoms.   ED Prescriptions     Medication Sig Dispense Auth. Provider   fluconazole (DIFLUCAN) 150 MG tablet Take 1 tablet (150 mg total) by mouth daily for 1 day. Take 1 tablet by mouth, if no improvement after 72 hours may take the second tablet. 2 tablet Ward, Tylene Fantasia, PA-C      PDMP not reviewed this encounter.   Ward, Tylene Fantasia, PA-C 05/07/22 2104    Ward, Tylene Fantasia, PA-C 06/16/22 [phone]

## 2022-05-07 NOTE — Discharge Instructions (Signed)
Take Diflucan as prescribed. Will call with test results and change treatment plan if indicated. Return if you develop new or worsening symptoms.

## 2022-05-07 NOTE — ED Triage Notes (Signed)
C/O vaginal discharge x 1 weeks. Pt has used vaginal cream with no relief.

## 2022-05-08 LAB — CERVICOVAGINAL ANCILLARY ONLY
Bacterial Vaginitis (gardnerella): NEGATIVE
Candida Glabrata: NEGATIVE
Candida Vaginitis: NEGATIVE
Chlamydia: NEGATIVE
Comment: NEGATIVE
Comment: NEGATIVE
Comment: NEGATIVE
Comment: NEGATIVE
Comment: NEGATIVE
Comment: NORMAL
Neisseria Gonorrhea: NEGATIVE
Trichomonas: NEGATIVE

## 2022-07-19 ENCOUNTER — Encounter (HOSPITAL_COMMUNITY): Payer: Self-pay

## 2022-07-19 ENCOUNTER — Emergency Department (HOSPITAL_COMMUNITY)
Admission: EM | Admit: 2022-07-19 | Discharge: 2022-07-19 | Disposition: A | Discharge status: Home / Self Care | Payer: No Typology Code available for payment source | Attending: Emergency Medicine | Admitting: Emergency Medicine

## 2022-07-19 ENCOUNTER — Emergency Department (HOSPITAL_COMMUNITY): Payer: No Typology Code available for payment source

## 2022-07-19 ENCOUNTER — Other Ambulatory Visit: Payer: Self-pay

## 2022-07-19 DIAGNOSIS — R079 Chest pain, unspecified: Secondary | ICD-10-CM | POA: Diagnosis present

## 2022-07-19 DIAGNOSIS — R072 Precordial pain: Secondary | ICD-10-CM | POA: Insufficient documentation

## 2022-07-19 DIAGNOSIS — E039 Hypothyroidism, unspecified: Secondary | ICD-10-CM | POA: Insufficient documentation

## 2022-07-19 LAB — BASIC METABOLIC PANEL WITH GFR
Anion gap: 8 (ref 5–15)
BUN: 11 mg/dL (ref 6–20)
CO2: 25 mmol/L (ref 22–32)
Calcium: 8.8 mg/dL — ABNORMAL LOW (ref 8.9–10.3)
Chloride: 101 mmol/L (ref 98–111)
Creatinine, Ser: 0.73 mg/dL (ref 0.44–1.00)
GFR, Estimated: >60 mL/min (ref >60)
Glucose, Bld: 95 mg/dL (ref 70–99)
Potassium: 3.7 mmol/L (ref 3.5–5.1)
Sodium: 134 mmol/L — ABNORMAL LOW (ref 135–145)

## 2022-07-19 LAB — CBC
HCT: 40.2 % (ref 36.0–46.0)
Hemoglobin: 13.5 g/dL (ref 12.0–15.0)
MCH: 32.1 pg (ref 26.0–34.0)
MCHC: 33.6 g/dL (ref 30.0–36.0)
MCV: 95.7 fL (ref 80.0–100.0)
Platelets: 367 K/uL (ref 150–400)
RBC: 4.2 MIL/uL (ref 3.87–5.11)
RDW: 12.3 % (ref 11.5–15.5)
WBC: 9.6 K/uL (ref 4.0–10.5)
nRBC: 0 % (ref 0.0–0.2)

## 2022-07-19 LAB — TROPONIN I (HIGH SENSITIVITY)
Troponin I (High Sensitivity): <2 ng/L (ref <18)
Troponin I (High Sensitivity): <2 ng/L (ref <18)

## 2022-07-19 LAB — I-STAT BETA HCG BLOOD, ED (MC, WL, AP ONLY): I-stat hCG, quantitative: <5 m[IU]/mL (ref <5)

## 2022-07-19 LAB — D-DIMER, QUANTITATIVE: D-Dimer, Quant: 0.43 ug{FEU}/mL (ref 0.00–0.50)

## 2022-07-19 MED ORDER — MELOXICAM 7.5 MG PO TABS: 7.5000 mg | ORAL_TABLET | Freq: Every day | ORAL | 0 refills | Status: AC

## 2022-07-19 MED ORDER — ALUM & MAG HYDROXIDE-SIMETH 200-200-20 MG/5ML PO SUSP
30.0000 mL | Freq: Once | ORAL | Status: AC
  Administered 2022-07-19: 30 mL via ORAL
  Filled 2022-07-19: qty 30

## 2022-07-19 NOTE — ED Provider Notes (Signed)
Emergency Department Provider Note   I have reviewed the triage vital signs and the nursing notes.   HISTORY  Chief Complaint Chest Pain   HPI Melanie Cervantes is a 42 y.o. female with PMH of complex partial seizure, anxiety, and ADHD presents to the ED with CP radiating to the left arm.  Symptoms ongoing for the last 2-3 days. Describes a central pressure but in the last several hours has become sharp. 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   The triage note mentions patient being off her Keppra this past week but she has since located her Rx and will be re-starting. No known breakthrough seizures.    Past Medical History:  Diagnosis Date   ADHD (attention deficit hyperactivity disorder)    Anxiety    Complex partial seizure ([place])    Depression    Endometriosis determined by laparoscopy    Headache    Hypothyroid     Review of Systems  Constitutional: No fever/chills Eyes: No visual changes. ENT: No sore throat. Cardiovascular: Positive chest pain. Respiratory: Positive shortness of breath. Gastrointestinal: No abdominal pain.  No nausea, no vomiting.  No diarrhea.   Musculoskeletal: Negative for back pain. Skin: Negative for rash. Neurological: Negative for headaches.  ____________________________________________   PHYSICAL EXAM:  VITAL SIGNS: ED Triage Vitals  Enc Vitals Group     BP 07/19/22 1957 117/87     Pulse Rate 07/19/22 1957 73     Resp 07/19/22 1957 18     Temp 07/19/22 1957 98.7 F (37.1 C)     Temp Source 07/19/22 1957 Oral     SpO2 07/19/22 1957 98 %     Weight 07/19/22 1957 195 lb (88.5 kg)     Height 07/19/22 1957 '5\' 5"'$  (1.651 m)   Constitutional: Alert and oriented. Well appearing and in no acute distress. Eyes: Conjunctivae are normal.  Head: Atraumatic. Nose:  No congestion/rhinnorhea. Mouth/Throat: Mucous membranes are moist.   Neck: No stridor.   Cardiovascular: Normal rate, regular rhythm. Good peripheral circulation. Grossly normal heart sounds.   Respiratory: Normal respiratory effort.  No retractions. Lungs CTAB. Gastrointestinal: Soft and nontender. No distention.  Musculoskeletal: No gross deformities of extremities. Neurologic:  Normal speech and language.  Skin:  Skin is warm, dry and intact. No rash noted.   ____________________________________________   LABS (all labs ordered are listed, but only abnormal results are displayed)  Labs Reviewed  BASIC METABOLIC PANEL - Abnormal; Notable for the following components:      Result Value   Sodium 134 (*)    Calcium 8.8 (*)    All other components within normal limits  CBC  D-DIMER, QUANTITATIVE  I-STAT BETA HCG BLOOD, ED ([facility], [facility], [facility] ONLY)  TROPONIN I (HIGH SENSITIVITY)  TROPONIN I (HIGH SENSITIVITY)   ____________________________________________  EKG   EKG Interpretation  Date/Time:  Thursday July 19 2022 19:57:15 EST Ventricular Rate:  74 PR Interval:  155 QRS Duration: 82 QT Interval:  386 QTC Calculation: 429 R Axis:   37 Text Interpretation: Sinus rhythm Confirmed by Nanda Quinton [id number] on 07/19/2022 9:05:50 PM        ____________________________________________  RADIOLOGY  DG Chest 2 View  Result Date: 07/19/2022 CLINICAL DATA:  Chest pain EXAM: CHEST - 2 VIEW COMPARISON:  None  Available. FINDINGS: No consolidation, pneumothorax or effusion. Normal cardiopericardial silhouette without edema. Overlapping cardiac leads. IMPRESSION: No acute cardiopulmonary disease Electronically Signed   By: Jill Side M.D.   On: 07/19/2022 20:16    ____________________________________________   PROCEDURES  Procedure(s) performed:   Procedures  None  ____________________________________________   INITIAL IMPRESSION / ASSESSMENT AND PLAN / ED  COURSE  Pertinent labs & imaging results that were available during my care of the patient were reviewed by me and considered in my medical decision making (see chart for details).   This patient is Presenting for Evaluation of CP, which does require a range of treatment options, and is a complaint that involves a high risk of morbidity and mortality.  The Differential Diagnoses includes but is not exclusive to acute coronary syndrome, aortic dissection, pulmonary embolism, cardiac tamponade, community-acquired pneumonia, pericarditis, musculoskeletal chest wall pain, etc.   Critical Interventions-    Medications  alum & mag hydroxide-simeth (MAALOX/MYLANTA) 200-200-20 MG/5ML suspension 30 mL (30 mLs Oral Given 07/19/22 2220)    Reassessment after intervention: pain improved.    I decided to review pertinent External Data, and in summary no prior cath history in our system for review.   Clinical Laboratory Tests Ordered, included troponin negative. Pregnancy negative. No anemia. No AKI.   Radiologic Tests Ordered, included CXR. I independently interpreted the images and agree with radiology interpretation.   Cardiac Monitor Tracing which shows NSR.    Social Determinants of Health Risk patient is a non-smoker.   Consult complete with  Medical Decision Making: Summary:  Patient presents emergency for evaluation of several days of chest pain some shortness of breath.  Some sharp component of the pain described in the last few hours.  Overall lower risk for PE but do plan for D-dimer given the sharp component which has developed in some subjective shortness of breath symptoms.  No hypoxemia or tachycardia.  EKG and initial troponin are reassuring.  Plan for trending.  Reevaluation with update and discussion with patient.  Troponin negative x 2.  D-dimer negative.  Stable for discharge.  Plan for cardiology referral.  Patient to resume her Keppra.  Considered admission but symptoms  improved. And workup reassuring.   Patient's presentation is most consistent with acute presentation with potential threat to life or bodily function.   Disposition: discharge  ____________________________________________  FINAL CLINICAL IMPRESSION(S) / ED DIAGNOSES  Final diagnoses:  Precordial chest pain     NEW OUTPATIENT MEDICATIONS STARTED DURING THIS VISIT:  Discharge Medication List as of 07/19/2022 11:12 PM     START taking these medications   Details  meloxicam (MOBIC) 7.5 MG tablet Take 1 tablet (7.5 mg total) by mouth daily for 10 days., Starting Thu 07/19/2022, Until Sun 07/29/2022, Normal        Note:  This document was prepared using Dragon voice recognition software and may include unintentional dictation errors.  Nanda Quinton, MD, [facility]    Kelby Lotspeich, Wonda Olds, MD 07/19/22 [phone]

## 2022-07-19 NOTE — ED Triage Notes (Signed)
Pt presents with CP x several days.  Central radiating to left shoulder.  Tight "squeezing"  States she had a heart attack about 15 years ago.  Has not gotten any stents and does not take blood thinners. States she has seizures but has not taken her keppra this week because she lost her bottle of keppra.

## 2022-07-19 NOTE — Discharge Instructions (Signed)

## 2022-07-19 NOTE — ED Notes (Signed)
Patient given discharge instructions and follow up care. Patient verbalized understanding. IV removed with catheter intact. Patient ambulatory out of ED by self.

## 2022-07-20 ENCOUNTER — Encounter: Payer: Self-pay | Admitting: Internal Medicine

## 2022-07-20 ENCOUNTER — Ambulatory Visit: Payer: No Typology Code available for payment source | Attending: Internal Medicine | Admitting: Internal Medicine

## 2022-07-20 VITALS — BP 110/72 | HR 95 | Ht 65.0 in | Wt 185.6 lb

## 2022-07-20 DIAGNOSIS — R079 Chest pain, unspecified: Secondary | ICD-10-CM | POA: Diagnosis not present

## 2022-07-20 NOTE — Patient Instructions (Signed)
Medication Instructions:  Your physician recommends that you continue on your current medications as directed. Please refer to the Current Medication list given to you today.  *If you need a refill on your cardiac medications before your next appointment, please call your pharmacy*   Testing/Procedures: Your physician has requested that you have an exercise tolerance test.  Please also follow instruction sheet, as given. This will take place at [address], [address] Do not drink or eat foods with caffeine for 24 hours before the test. (Chocolate, coffee, tea, or energy drinks) If you use an inhaler, bring it with you to the test. Do not smoke for 4 hours before the test. Wear comfortable shoes and clothing.    Follow-Up: At [facility], you and your health needs are our priority.  As part of our continuing mission to provide you with exceptional heart care, we have created designated Provider Care Teams.  These Care Teams include your primary Cardiologist (physician) and Advanced Practice Providers (APPs -  Physician Assistants and Nurse Practitioners) who all work together to provide you with the care you need, when you need it.  We recommend signing up for the patient portal called "MyChart".  Sign up information is provided on this After Visit Summary.  MyChart is used to connect with patients for Virtual Visits (Telemedicine).  Patients are able to view lab/test results, encounter notes, upcoming appointments, etc.  Non-urgent messages can be sent to your provider as well.   To learn more about what you can do with MyChart, go to [email or web address].    Your next appointment:   We will see you on an as needed basis.  Provider:   Dr. Phineas Inches

## 2022-07-20 NOTE — Progress Notes (Signed)
Cardiology Office Note:    Date:  07/20/2022   ID:  Melanie Cervantes, DOB [birth year], MRN [id number]  PCP:  Patient, No Pcp Per   [facility] Providers Cardiologist:  None     Referring MD: Melanie Fast, MD   No chief complaint on file. Atypical CP  History of Present Illness:    Melanie Cervantes is a 42 y.o. female with a hx of ADHD, partial seizure, depression, referral from the ED. She notes L chest pain with radiation down the L arm. She notes it with activity on a treadmill. She notes some coughing. Troponin x2. EKG is sinus rhythm, no ischemic changes. She feels a sharp chest pain.  She notes it has been two weeks. Non smoker. Father has hx of 3 MI and pacemaker. She is in [employer]; not active. No prior similar symptoms before.   Past Medical History:  Diagnosis Date   ADHD (attention deficit hyperactivity disorder)    Anxiety    Complex partial seizure ([place])    Depression    Endometriosis determined by laparoscopy    Headache    Hypothyroid     Past Surgical History:  Procedure Laterality Date   ADENOIDECTOMY     CESAREAN SECTION     C/S x 1   laparoscopy     SEPTOPLASTY     TONSILLECTOMY      Current Medications: Current Outpatient Medications on File Prior to Visit  Medication Sig Dispense Refill   acetaminophen (TYLENOL) 500 MG tablet Take 1 tablet (500 mg total) by mouth every 6 (six) hours as needed. 30 tablet 0   ascorbic acid (VITAMIN C) 500 MG tablet Take 500 mg by mouth daily.     bacitracin ointment Apply 1 application topically 2 (two) times daily. 120 g 0   cholecalciferol (VITAMIN D3) 25 MCG (1000 UNIT) tablet Take 1,000 Units by mouth daily.     levETIRAcetam (KEPPRA) 250 MG tablet Take 500 mg by mouth 3 (three) times daily.     levothyroxine (SYNTHROID, LEVOTHROID) 25 MCG tablet Take 25 mcg by mouth daily before breakfast.     meloxicam (MOBIC) 7.5 MG tablet Take 1 tablet (7.5 mg total) by mouth daily for 10 days. 10  tablet 0   No current facility-administered medications on file prior to visit.    Allergies:   Lexapro [escitalopram]   Social History   Socioeconomic History   Marital status: Single    Spouse name: Not on file   Number of children: Not on file   Years of education: Not on file   Highest education level: Not on file  Occupational History   Not on file  Tobacco Use   Smoking status: Never   Smokeless tobacco: Never  Substance and Sexual Activity   Alcohol use: No   Drug use: No   Sexual activity: Yes  Other Topics Concern   Not on file  Social History Narrative   Not on file   Social Determinants of Health   Financial Resource Strain: Not on file  Food Insecurity: Not on file  Transportation Needs: Not on file  Physical Activity: Not on file  Stress: Not on file  Social Connections: Not on file     Family History: Per above  ROS:   Please see the history of present illness.     All other systems reviewed and are negative.  EKGs/Labs/Other Studies Reviewed:    The following studies were reviewed today:  EKG:  EKG is  ordered today.  The ekg ordered today demonstrates   07/20/2022- NSR  Recent Labs: 07/19/2022: BUN 11; Creatinine, Ser 0.73; Hemoglobin 13.5; Platelets 367; Potassium 3.7; Sodium 134  Recent Lipid Panel No results found for: "CHOL", "TRIG", "HDL", "CHOLHDL", "VLDL", "LDLCALC", "LDLDIRECT"   Risk Assessment/Calculations:     Physical Exam:    VS:  LMP 07/16/2022     Wt Readings from Last 3 Encounters:  07/19/22 195 lb (88.5 kg)  07/29/19 195 lb (88.5 kg)  09/12/17 189 lb (85.7 kg)     GEN:  Well nourished, well developed in no acute distress HEENT: Normal NECK: No JVD; No carotid bruits LYMPHATICS: No lymphadenopathy CARDIAC: RRR, no murmurs, rubs, gallops RESPIRATORY:  Clear to auscultation without rales, wheezing or rhonchi  ABDOMEN: Soft, non-tender, non-distended MUSCULOSKELETAL:  No edema; No deformity  SKIN: Warm and  dry NEUROLOGIC:  Alert and oriented x 3 PSYCHIATRIC:  Normal affect   ASSESSMENT:    Chest pain:  pretest probability is low based on her risk factors. She notes CP with activity and is concerned for heart dx. Will get a POET PLAN:    In order of problems listed above:  POET      Shared Decision Making/Informed Consent The risks [chest pain, shortness of breath, cardiac arrhythmias, dizziness, blood pressure fluctuations, myocardial infarction, stroke/transient ischemic attack, and life-threatening complications (estimated to be 1 in 10,000)], benefits (risk stratification, diagnosing coronary artery disease, treatment guidance) and alternatives of an exercise tolerance test were discussed in detail with Ms. Melanie Cervantes and she agrees to proceed.    Medication Adjustments/Labs and Tests Ordered: Current medicines are reviewed at length with the patient today.  Concerns regarding medicines are outlined above.  No orders of the defined types were placed in this encounter.  No orders of the defined types were placed in this encounter.   There are no Patient Instructions on file for this visit.   Signed, Janina Mayo, MD  07/20/2022 9:21 AM    [place]

## 2022-07-23 ENCOUNTER — Ambulatory Visit: Payer: No Typology Code available for payment source | Admitting: Internal Medicine

## 2022-07-26 ENCOUNTER — Telehealth: Payer: Self-pay

## 2022-07-26 NOTE — Telephone Encounter (Signed)
Called patient and made her aware of the ETT testing change of day.  07-26-22 VB

## 2022-08-06 ENCOUNTER — Telehealth (HOSPITAL_COMMUNITY): Payer: Self-pay | Admitting: *Deleted

## 2022-08-06 NOTE — Telephone Encounter (Signed)
Instructions given to patient for upcoming ETT.  Melanie Cervantes

## 2022-08-09 ENCOUNTER — Encounter (HOSPITAL_COMMUNITY): Payer: No Typology Code available for payment source

## 2022-08-15 ENCOUNTER — Ambulatory Visit (HOSPITAL_COMMUNITY): Payer: No Typology Code available for payment source | Attending: Internal Medicine
